# Patient Record
Sex: Female | Born: 1975 | Race: White | Hispanic: No | Marital: Married | Smoking: Never smoker
Health system: Southern US, Community
[De-identification: ages and names within clinical notes are randomized; demographics above are authoritative.]

## PROBLEM LIST (undated history)

## (undated) DIAGNOSIS — E611 Iron deficiency: Secondary | ICD-10-CM

## (undated) DIAGNOSIS — M797 Fibromyalgia: Secondary | ICD-10-CM

## (undated) DIAGNOSIS — C801 Malignant (primary) neoplasm, unspecified: Secondary | ICD-10-CM

## (undated) HISTORY — PX: BREAST BIOPSY: SHX20

## (undated) HISTORY — PX: ENDOMETRIAL ABLATION: SHX621

## (undated) MED FILL — Iron Sucrose Inj 20 MG/ML (Fe Equiv): INTRAVENOUS | Qty: 10 | Status: AC

## (undated) NOTE — *Deleted (*Deleted)
[facility]  [address] [place], [place] [address] Phone: [phone]  Fax: [phone]   Clinic Day:  07/05/2020  Referring physician: Christeen Douglas, MD  Chief Complaint: Alicia Washington is a 29 y.o. female with iron deficiency and B12 deficiency who is seen for 6 month assessment.    HPI: The patient was last seen in the hematology clinic on 01/05/2020. At that time, she felt that she was becoming fatigued.  Exam was stable. Methylmalonic acid was 129.  Vitamin B12 was 448 and folate was 9.2.  She received a vitamin B12 injection.  She received Venofer weekly x 2 (01/05/2020 - 01/12/2020).   Labs on 04/11/2020 showed a hematocrit of 38.0, hemoglobin 13.0, platelets 267,000, WBC 7,300. Ferritin was 102.  During the interim, ***   Past Medical History:  Diagnosis Date  . Cancer (HCC)    skin  . Fibromyalgia   . Low iron     Past Surgical History:  Procedure Laterality Date  . BREAST BIOPSY Left    core- neg  . CESAREAN SECTION    . ENDOMETRIAL ABLATION      Family History  Problem Relation Age of Onset  . Cancer Mother   . Cancer Maternal Grandfather   . Breast cancer Neg Hx     Social History:  reports that she has never smoked. She has never used smokeless tobacco. She reports that she does not drink alcohol and does not use drugs. She has 2 children (daughter 50, son 97). 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 She lives in [place]. 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 If one child gets COVID every one has to go into quarantine and stay home. The patient is alone*** today.  Allergies: No Known Allergies  Current Medications: Current Outpatient Medications  Medication Sig Dispense Refill  . Cyanocobalamin 1000 MCG/ML KIT Inject 1,000 mcg as directed.    . fluticasone (FLONASE) 50 MCG/ACT nasal spray Place into the nose.    . IRON SUCROSE IV Inject into the vein every 30 (thirty) days.    Marland Kitchen  loratadine (CLARITIN) 10 MG tablet Take by mouth.    Marland Kitchen omeprazole (PRILOSEC) 40 MG capsule Take 40 mg by mouth daily.  1  . predniSONE (DELTASONE) 20 MG tablet 3 tabs po qd x 2 days, then 2 tabs po qd x 3 days, then 1 tab po qd x 3 days, then half a tab po qd x 2 days (Patient not taking: Reported on 01/05/2020) 16 tablet 0  . sertraline (ZOLOFT) 100 MG tablet Take 100 mg by mouth daily.      No current facility-administered medications for this visit.   Facility-Administered Medications Ordered in Other Visits  Medication Dose Route Frequency Provider Last Rate Last Admin  . sodium chloride flush (NS) 0.9 % injection 10 mL  10 mL Intracatheter Once PRN Verlee Monte, NP      . sodium chloride flush (NS) 0.9 % injection 3 mL  3 mL Intracatheter Once PRN Verlee Monte, NP        Review of Systems  Constitutional: Positive for malaise/fatigue (persists). Negative for chills, diaphoresis, fever and weight loss.       Feels "good".   HENT: Negative.  Negative for congestion, ear pain, hearing loss, nosebleeds, sinus pain and sore throat.   Eyes: Negative.  Negative for blurred vision, double vision and photophobia.  Respiratory: Positive for shortness of breath (with  exertion x 1 month). Negative for cough, hemoptysis and sputum production.   Cardiovascular: Positive for palpitations (intermittent; improved). Negative for chest pain, orthopnea, leg swelling and PND.  Gastrointestinal: Negative for abdominal pain, blood in stool, constipation, diarrhea, heartburn, melena, nausea and vomiting.  Genitourinary: Negative.  Negative for dysuria, frequency, hematuria and urgency.  Musculoskeletal: Negative for back pain, joint pain, myalgias and neck pain.  Skin: Negative.  Negative for rash.  Neurological: Negative.  Negative for dizziness, tremors, sensory change, speech change, focal weakness, weakness and headaches.  Endo/Heme/Allergies: Negative.   Psychiatric/Behavioral: Negative.  Negative for  depression and memory loss. The patient is not nervous/anxious and does not have insomnia.   All other systems reviewed and are negative.  Performance status (ECOG): 1 - Symptomatic but completely ambulatory***  Vitals There were no vitals taken for this visit.   Physical Exam Vitals and nursing note reviewed.  Constitutional:      General: She is not in acute distress.    Appearance: She is well-developed. She is not diaphoretic.  HENT:     Head: Normocephalic and atraumatic.     Mouth/Throat:     Pharynx: No oropharyngeal exudate.  Eyes:     General: No scleral icterus.       Right eye: No discharge.        Left eye: No discharge.     Conjunctiva/sclera: Conjunctivae normal.     Pupils: Pupils are equal, round, and reactive to light.     Comments: Blue eyes.   Neck:     Vascular: No JVD.  Cardiovascular:     Rate and Rhythm: Normal rate and regular rhythm.     Heart sounds: Normal heart sounds. No murmur heard.  No friction rub. No gallop.   Pulmonary:     Effort: Pulmonary effort is normal. No respiratory distress.     Breath sounds: Normal breath sounds. No wheezing or rales.  Abdominal:     General: Bowel sounds are normal. There is no distension.     Palpations: Abdomen is soft. There is no mass.     Tenderness: There is no abdominal tenderness. There is no guarding or rebound.  Musculoskeletal:        General: No tenderness. Normal range of motion.     Cervical back: Normal range of motion and neck supple.  Lymphadenopathy:     Head:     Right side of head: No preauricular, posterior auricular or occipital adenopathy.     Left side of head: No preauricular, posterior auricular or occipital adenopathy.     Cervical: No cervical adenopathy.     Upper Body:     Right upper body: No supraclavicular adenopathy.     Left upper body: No supraclavicular adenopathy.  Skin:    General: Skin is warm and dry.     Coloration: Skin is not pale.     Findings: No erythema or  rash.  Neurological:     Mental Status: She is alert and oriented to person, place, and time.  Psychiatric:        Behavior: Behavior normal.        Thought Content: Thought content normal.        Judgment: Judgment normal.    No visits with results within 3 Day(s) from this visit.  Latest known visit with results is:  Appointment on 04/11/2020  Component Date Value Ref Range Status  . Ferritin 04/11/2020 102  11 - 307 ng/mL Final   Performed  at [facility], [address], [place], [place] [address]  . WBC 04/11/2020 7.3  4.0 - 10.5 K/uL Final  . RBC 04/11/2020 4.34  3.87 - 5.11 MIL/uL Final  . Hemoglobin 04/11/2020 13.0  12.0 - 15.0 g/dL Final  . HCT 13/03/6577 38.0  36 - 46 % Final  . MCV 04/11/2020 87.6  80.0 - 100.0 fL Final  . MCH 04/11/2020 30.0  26.0 - 34.0 pg Final  . MCHC 04/11/2020 34.2  30.0 - 36.0 g/dL Final  . RDW 46/96/2952 12.7  11.5 - 15.5 % Final  . Platelets 04/11/2020 267  150 - 400 K/uL Final  . nRBC 04/11/2020 0.0  0.0 - 0.2 % Final  . Neutrophils Relative % 04/11/2020 66  % Final  . Neutro Abs 04/11/2020 4.8  1.7 - 7.7 K/uL Final  . Lymphocytes Relative 04/11/2020 24  % Final  . Lymphs Abs 04/11/2020 1.8  0.7 - 4.0 K/uL Final  . Monocytes Relative 04/11/2020 7  % Final  . Monocytes Absolute 04/11/2020 0.5  0.1 - 1.0 K/uL Final  . Eosinophils Relative 04/11/2020 2  % Final  . Eosinophils Absolute 04/11/2020 0.1  0.0 - 0.5 K/uL Final  . Basophils Relative 04/11/2020 0  % Final  . Basophils Absolute 04/11/2020 0.0  0.0 - 0.1 K/uL Final  . Immature Granulocytes 04/11/2020 1  % Final  . Abs Immature Granulocytes 04/11/2020 0.04  0.00 - 0.07 K/uL Final   Performed at [facility], [address], [place], [place] [address]    Assessment:  ROGELIO WINBUSH is a 76 y.o. female with a long standing history ofiron deficiency anemia. Dietis modest. She is unable to tolerate oral iron. She has chronic diarrhea with some dark stools.   Work-up on 04/30/2018 revealed a hematocrit 23.9, hemoglobin 7.0, and MCV 64.5. Ferritin was 2. Iron saturation was 2% with a TIBC of 499. Reticulocyte count was 2.5%. B12 was 184. Normal studies included: TSH, ANA, and folate (13.5). Urinalysis on 05/07/2018 revealed no hematuria.   She has received IV ironin the past. She received 1 unit of PRBCson 05/01/2018. She received Venoferweekly x 4 (04/30/2018 - 05/21/2018),x 2 (06/12/2018 and 06/23/2018), x4 (12/22/2018 - 01/12/2019), 07/15/2019, x 3 (09/15/2019 - 09/30/2019), and x 2 (01/05/2020 - 01/12/2020).  Ferritinhas been followed: 4 on 08/14/2011, 133 on 10/24/2011, 125 on 12/17/2011, 18 on 10/08/2012, 3 on 04/24/2018, 2 on 04/30/2018, 52 on 06/11/2018, 63 on 08/11/2018, 33 on 11/16/2018, 19 on 12/15/2018, 80 on 02/16/2019, 46 on 03/31/2019, 33 on 05/21/2019, 21 on 07/07/2019, 13 on 09/10/2019, 99 on 11/10/2019, 57 on 01/04/2020, and 102 on 04/11/2020.  She receives Venofer when ferritin < 100.  EGD on 05/11/2018 revealed erythematous mucosa in the antrum (minmal chronic gastritis and mild foveolar hyperplasia) and a 1 cm hiatal hernia. Colonoscopyon 05/11/2018 was normal. Random biopsies revealed no abnormalities.Capsule enteroscopyon 06/15/2018 was normal.  She has B12 deficiency. Intrinsic factor and anti-parietal cell antibody were normal on 06/12/2018. She began oral B12 on 05/01/2018. She began B12 injections on 06/12/2018 (last 01/05/2020).B12 was 184 on 04/30/2018,316 on 06/11/2018, 380 on 09/22/2018, and 448 on 01/05/2020.Folate was 9.2 on 01/05/2020.  Symptomatically, ***  Plan: 1.   Review labs***  2.Iron deficiency anemia Hematocrit 37.7.  Hemoglobin12.4. MCV 88.9. Ferritin57. PriorEGD revealed mild gastritis. Colonoscopy and capsule study were normal. Guaiac card x 1 + (unclear significance with hemorrhoids).  Urinalysiswas negative on 12/15/2018. Patient notes symptoms when her ferritin is < 100.  Venofer today and  weekly x 1 (total 2).   Urine pregnancy test. 3. B12 deficiency B12 today and monthly (last 01/05/2020).             Check folate annually. 4.   Fatigue            TSH and free T4 were normal on 09/15/2019.  Check B12 and MMA per patient request. 5.   RTC in 3 months for labs (CBC and ferritin). 6.   RTC in 6 months for MD assessment, labs (CBC with diff, ferritin- day before), and +/- Venofer.  I discussed the assessment and treatment plan with the patient.  The patient was provided an opportunity to ask questions and all were answered.  The patient agreed with the plan and demonstrated an understanding of the instructions.  The patient was advised to call back if the symptoms worsen or if the condition fails to improve as anticipated.  I provided *** minutes of face-to-face time during this this encounter and > 50% was spent counseling as documented under my assessment and plan.  Rosey Bath, MD, PhD    07/05/2020, 2:06 PM   I, Danella Penton Tufford, am acting as a Neurosurgeon for Rosey Bath, MD.  I, Mckynleigh Mussell C. Merlene Pulling, MD, have reviewed the above documentation for accuracy and completeness, and I agree with the above.

---

## 2011-04-25 ENCOUNTER — Ambulatory Visit: Payer: Self-pay | Admitting: Obstetrics & Gynecology

## 2011-06-06 ENCOUNTER — Ambulatory Visit: Payer: Self-pay | Admitting: Internal Medicine

## 2011-06-14 ENCOUNTER — Ambulatory Visit: Payer: Self-pay | Admitting: Internal Medicine

## 2011-07-06 ENCOUNTER — Ambulatory Visit: Payer: Self-pay | Admitting: Internal Medicine

## 2011-08-14 ENCOUNTER — Ambulatory Visit: Payer: Self-pay | Admitting: Internal Medicine

## 2011-08-14 LAB — IRON AND TIBC
Iron Bind.Cap.(Total): 417 ug/dL (ref 250–450)
Iron Saturation: 8 %
Iron: 34 ug/dL — ABNORMAL LOW (ref 50–170)
Unbound Iron-Bind.Cap.: 383 ug/dL

## 2011-08-14 LAB — CBC CANCER CENTER
Basophil #: 0 x10 3/mm (ref 0.0–0.1)
Basophil %: 0.5 %
Eosinophil #: 0.1 x10 3/mm (ref 0.0–0.7)
Eosinophil %: 1.9 %
HCT: 33.4 % — ABNORMAL LOW (ref 35.0–47.0)
HGB: 11.2 g/dL — ABNORMAL LOW (ref 12.0–16.0)
Lymphocyte #: 1.5 x10 3/mm (ref 1.0–3.6)
Lymphocyte %: 30.7 %
MCH: 26.1 pg (ref 26.0–34.0)
MCHC: 33.5 g/dL (ref 32.0–36.0)
MCV: 78 fL — ABNORMAL LOW (ref 80–100)
Monocyte #: 0.5 x10 3/mm (ref 0.0–0.7)
Monocyte %: 10.3 %
Neutrophil #: 2.7 x10 3/mm (ref 1.4–6.5)
Neutrophil %: 56.6 %
Platelet: 258 x10 3/mm (ref 150–440)
RBC: 4.28 10*6/uL (ref 3.80–5.20)
RDW: 14.7 % — ABNORMAL HIGH (ref 11.5–14.5)
WBC: 4.8 x10 3/mm (ref 3.6–11.0)

## 2011-08-14 LAB — FERRITIN: Ferritin (ARMC): 4 ng/mL — ABNORMAL LOW (ref 8–388)

## 2011-09-06 ENCOUNTER — Ambulatory Visit: Payer: Self-pay | Admitting: Internal Medicine

## 2011-10-24 ENCOUNTER — Ambulatory Visit: Payer: Self-pay | Admitting: Internal Medicine

## 2011-10-24 LAB — IRON AND TIBC
Iron Bind.Cap.(Total): 291 ug/dL (ref 250–450)
Iron Saturation: 23 %
Iron: 68 ug/dL (ref 50–170)
Unbound Iron-Bind.Cap.: 223 ug/dL

## 2011-10-24 LAB — CBC CANCER CENTER
Basophil #: 0 x10 3/mm (ref 0.0–0.1)
Basophil %: 0.8 %
Eosinophil #: 0.1 x10 3/mm (ref 0.0–0.7)
Eosinophil %: 2.8 %
HCT: 40.9 % (ref 35.0–47.0)
HGB: 13.5 g/dL (ref 12.0–16.0)
Lymphocyte #: 1.3 x10 3/mm (ref 1.0–3.6)
Lymphocyte %: 31.4 %
MCH: 28.1 pg (ref 26.0–34.0)
MCHC: 33 g/dL (ref 32.0–36.0)
MCV: 85.3 fL (ref 80–100)
Monocyte #: 0.4 x10 3/mm (ref 0.0–0.7)
Monocyte %: 8.6 %
Neutrophil #: 2.4 x10 3/mm (ref 1.4–6.5)
Neutrophil %: 56.4 %
Platelet: 204 x10 3/mm (ref 150–440)
RBC: 4.8 10*6/uL (ref 3.80–5.20)
RDW: 16.5 % — ABNORMAL HIGH (ref 11.5–14.5)
WBC: 4.2 x10 3/mm (ref 3.6–11.0)

## 2011-10-24 LAB — FERRITIN: Ferritin (ARMC): 133 ng/mL (ref 8–388)

## 2011-11-04 ENCOUNTER — Ambulatory Visit: Payer: Self-pay | Admitting: Internal Medicine

## 2011-12-16 ENCOUNTER — Ambulatory Visit: Payer: Self-pay | Admitting: Internal Medicine

## 2011-12-17 LAB — CBC CANCER CENTER
Basophil #: 0 x10 3/mm (ref 0.0–0.1)
Basophil %: 0.6 %
Eosinophil #: 0.2 x10 3/mm (ref 0.0–0.7)
Eosinophil %: 3.7 %
HCT: 39.1 % (ref 35.0–47.0)
HGB: 13.4 g/dL (ref 12.0–16.0)
Lymphocyte #: 1.4 x10 3/mm (ref 1.0–3.6)
Lymphocyte %: 33.1 %
MCH: 30.1 pg (ref 26.0–34.0)
MCHC: 34.1 g/dL (ref 32.0–36.0)
MCV: 88 fL (ref 80–100)
Monocyte #: 0.4 x10 3/mm (ref 0.2–0.9)
Monocyte %: 9.6 %
Neutrophil #: 2.3 x10 3/mm (ref 1.4–6.5)
Neutrophil %: 53 %
Platelet: 195 x10 3/mm (ref 150–440)
RBC: 4.44 10*6/uL (ref 3.80–5.20)
RDW: 13 % (ref 11.5–14.5)
WBC: 4.4 x10 3/mm (ref 3.6–11.0)

## 2011-12-17 LAB — FERRITIN: Ferritin (ARMC): 125 ng/mL (ref 8–388)

## 2012-01-04 ENCOUNTER — Ambulatory Visit: Payer: Self-pay | Admitting: Internal Medicine

## 2012-02-18 ENCOUNTER — Ambulatory Visit: Payer: Self-pay | Admitting: Emergency Medicine

## 2012-09-24 ENCOUNTER — Ambulatory Visit: Payer: Self-pay | Admitting: Internal Medicine

## 2012-10-08 ENCOUNTER — Ambulatory Visit: Payer: Self-pay | Admitting: Internal Medicine

## 2012-10-08 LAB — IRON AND TIBC
Iron Bind.Cap.(Total): 371 ug/dL (ref 250–450)
Iron Saturation: 17 %
Iron: 63 ug/dL (ref 50–170)
Unbound Iron-Bind.Cap.: 308 ug/dL

## 2012-10-08 LAB — CBC CANCER CENTER
Basophil #: 0 x10 3/mm (ref 0.0–0.1)
Basophil %: 0.6 %
Eosinophil #: 0.1 x10 3/mm (ref 0.0–0.7)
Eosinophil %: 1.7 %
HCT: 36 % (ref 35.0–47.0)
HGB: 12.3 g/dL (ref 12.0–16.0)
Lymphocyte #: 1.6 x10 3/mm (ref 1.0–3.6)
Lymphocyte %: 28.3 %
MCH: 29.4 pg (ref 26.0–34.0)
MCHC: 34.3 g/dL (ref 32.0–36.0)
MCV: 86 fL (ref 80–100)
Monocyte #: 0.5 x10 3/mm (ref 0.2–0.9)
Monocyte %: 8.4 %
Neutrophil #: 3.4 x10 3/mm (ref 1.4–6.5)
Neutrophil %: 61 %
Platelet: 253 x10 3/mm (ref 150–440)
RBC: 4.2 10*6/uL (ref 3.80–5.20)
RDW: 13 % (ref 11.5–14.5)
WBC: 5.5 x10 3/mm (ref 3.6–11.0)

## 2012-10-08 LAB — FERRITIN: Ferritin (ARMC): 18 ng/mL (ref 8–388)

## 2012-11-03 ENCOUNTER — Ambulatory Visit: Payer: Self-pay | Admitting: Internal Medicine

## 2015-04-26 ENCOUNTER — Ambulatory Visit: Payer: 59

## 2015-04-26 ENCOUNTER — Encounter: Payer: Self-pay | Admitting: Emergency Medicine

## 2015-04-26 ENCOUNTER — Ambulatory Visit
Admission: EM | Admit: 2015-04-26 | Discharge: 2015-04-26 | Disposition: A | Discharge status: Home / Self Care | Payer: 59 | Attending: Family Medicine | Admitting: Family Medicine

## 2015-04-26 DIAGNOSIS — D1720 Benign lipomatous neoplasm of skin and subcutaneous tissue of unspecified limb: Secondary | ICD-10-CM

## 2015-04-26 HISTORY — DX: Fibromyalgia: M79.7

## 2015-04-26 NOTE — Discharge Instructions (Signed)
Lipoma  A lipoma is a noncancerous (benign) tumor composed of fat cells. They are usually found under the skin (subcutaneous). A lipoma may occur in any tissue of the body that contains fat. Common areas for lipomas to appear include the back, shoulders, buttocks, and thighs. Lipomas are a very common soft tissue growth. They are soft and grow slowly. Most problems caused by a lipoma depend on where it is growing.  DIAGNOSIS   A lipoma can be diagnosed with a physical exam. These tumors rarely become cancerous, but radiographic studies can help determine this for certain. Studies used may include:  · Computerized X-ray scans (CT or CAT scan).  · Computerized magnetic scans (MRI).  TREATMENT   Small lipomas that are not causing problems may be watched. If a lipoma continues to enlarge or causes problems, removal is often the best treatment. Lipomas can also be removed to improve appearance. Surgery is done to remove the fatty cells and the surrounding capsule. Most often, this is done with medicine that numbs the area (local anesthetic). The removed tissue is examined under a microscope to make sure it is not cancerous. Keep all follow-up appointments with your caregiver.  SEEK MEDICAL CARE IF:   · The lipoma becomes larger or hard.  · The lipoma becomes painful, red, or increasingly swollen. These could be signs of infection or a more serious condition.  Document Released: 07/12/2002 Document Revised: 10/14/2011 Document Reviewed: 12/22/2009  ExitCare® Patient Information ©2015 [employer]. This information is not intended to replace advice given to you by your health care provider. Make sure you discuss any questions you have with your health care provider.

## 2015-04-26 NOTE — ED Provider Notes (Signed)
CSN: [id number]     Arrival date & time 04/26/15  1229 History   First MD Initiated Contact with Patient 04/26/15 1352     Chief Complaint  Patient presents with  . Cyst   (Consider location/radiation/quality/duration/timing/severity/associated sxs/prior Treatment) HPI   This a 39 year old female who presents with a large lump on the lateral aspect of her distal thigh as was diagnosed as a lipoma and she thinks she has found another one in the anterior thigh. Is very concerned this may be cancer her mother has history of tumors. Her previous primary care physician reassured her that it was only a lipoma but with the new growth that she has discovered she is more concerned now. She does not have any pain with these has not noticed any increase in size nor pain in her thigh with ambulation. His had no weight loss or change in appetite.  Past Medical History  Diagnosis Date  . Fibromyalgia    Past Surgical History  Procedure Laterality Date  . Cesarean section    . Endometrial ablation     Family History  Problem Relation Age of Onset  . Cancer Mother    Social History  Substance Use Topics  . Smoking status: Never Smoker   . Smokeless tobacco: None  . Alcohol Use: No   OB History    No data available     Review of Systems  Constitutional: Negative for chills, diaphoresis, activity change and fatigue.  All other systems reviewed and are negative.   Allergies  Review of patient's allergies indicates no known allergies.  Home Medications   Prior to Admission medications   Medication Sig Start Date End Date Taking? Authorizing Provider  sertraline (ZOLOFT) 100 MG tablet Take 125 mg by mouth daily.   Yes Historical Provider, MD   Meds Ordered and Administered this Visit  Medications - No data to display  BP 126/88 mmHg  Pulse 82  Temp(Src) 98.1 F (36.7 C) (Tympanic)  Resp 20  Ht 5\' 4"  (1.626 m)  Wt 136 lb (61.689 kg)  BMI 23.33 kg/m2  SpO2 99%  LMP  No data  found.   Physical Exam  Constitutional: She appears well-developed and well-nourished. No distress.  HENT:  Head: Normocephalic and atraumatic.  Musculoskeletal:  Semination left leg shows no obvious deformities. She has 2 lesions palpable under the skin. Is not fixed to the skin. One measures 3 cm x 5 cm which is the most proximal and anterior of the 2 lesions the second is much smaller again shows no deformity and has no fixation to the skin measuring 2-1/2 cm in diameter. There is no fluctuance or induration present.  Skin: She is not diaphoretic.  Nursing note and vitals reviewed.   ED Course  Procedures (including critical care time)  Labs Review Labs Reviewed - No data to display  Imaging Review Dg Femur Min 2 Views Left  04/26/2015   CLINICAL DATA:  Left lateral distal thigh lipoma, suprapatellar mass  EXAM: LEFT FEMUR 2 VIEWS  COMPARISON:  No similar prior exam is available at this institution for comparison or on [facility].  FINDINGS: There is no evidence of fracture or other focal bone lesions. Soft tissues are unremarkable.  IMPRESSION: No acute abnormality or radiopaque mass. Further evaluation of the questioned palpable finding could be performed with soft tissue ultrasound or MRI with contrast depending on the clinical presentation.   Electronically Signed   By: Conchita Paris M.D.   On: 04/26/2015  15:10     Visual Acuity Review  Right Eye Distance:   Left Eye Distance:   Bilateral Distance:    Right Eye Near:   Left Eye Near:    Bilateral Near:         MDM   1. Lipoma of lower extremity    Plan: 1. Diagnosis reviewed with patient 2. rx as per orders; risks, benefits, potential side effects reviewed with patient 3. Recommend supportive treatment with assurance 4. F/u prn if symptoms worsen or don't improve     Lorin Picket, PA-C 04/26/15 1518

## 2015-04-26 NOTE — ED Notes (Signed)
Cyst on left upper leg x days no pain

## 2015-09-14 ENCOUNTER — Other Ambulatory Visit: Payer: Self-pay | Admitting: Obstetrics and Gynecology

## 2015-09-14 DIAGNOSIS — Z1231 Encounter for screening mammogram for malignant neoplasm of breast: Secondary | ICD-10-CM

## 2015-09-25 ENCOUNTER — Ambulatory Visit: Payer: 59

## 2016-11-14 ENCOUNTER — Other Ambulatory Visit: Payer: Self-pay | Admitting: Obstetrics and Gynecology

## 2016-11-14 DIAGNOSIS — Z1231 Encounter for screening mammogram for malignant neoplasm of breast: Secondary | ICD-10-CM

## 2016-12-11 ENCOUNTER — Ambulatory Visit: Payer: 59

## 2017-01-08 ENCOUNTER — Other Ambulatory Visit: Payer: Self-pay | Admitting: Obstetrics & Gynecology

## 2017-01-14 ENCOUNTER — Ambulatory Visit
Admission: RE | Admit: 2017-01-14 | Discharge: 2017-01-14 | Disposition: A | Discharge status: Home / Self Care | Payer: 59 | Source: Ambulatory Visit | Attending: Obstetrics and Gynecology | Admitting: Obstetrics and Gynecology

## 2017-01-14 DIAGNOSIS — Z1231 Encounter for screening mammogram for malignant neoplasm of breast: Secondary | ICD-10-CM | POA: Insufficient documentation

## 2017-01-14 HISTORY — DX: Malignant (primary) neoplasm, unspecified: C80.1

## 2017-01-16 ENCOUNTER — Other Ambulatory Visit: Payer: Self-pay | Admitting: *Deleted

## 2017-01-16 ENCOUNTER — Inpatient Hospital Stay
Admission: RE | Admit: 2017-01-16 | Discharge: 2017-01-16 | Disposition: A | Discharge status: Home / Self Care | Payer: Self-pay | Source: Ambulatory Visit | Attending: *Deleted | Admitting: *Deleted

## 2017-01-16 DIAGNOSIS — Z9289 Personal history of other medical treatment: Secondary | ICD-10-CM

## 2017-06-14 ENCOUNTER — Emergency Department
Admission: EM | Admit: 2017-06-14 | Discharge: 2017-06-14 | Disposition: A | Discharge status: Home / Self Care | Payer: 59 | Attending: Emergency Medicine | Admitting: Emergency Medicine

## 2017-06-14 ENCOUNTER — Emergency Department: Payer: 59

## 2017-06-14 DIAGNOSIS — R0789 Other chest pain: Secondary | ICD-10-CM | POA: Insufficient documentation

## 2017-06-14 DIAGNOSIS — Z85828 Personal history of other malignant neoplasm of skin: Secondary | ICD-10-CM | POA: Diagnosis not present

## 2017-06-14 DIAGNOSIS — Z79899 Other long term (current) drug therapy: Secondary | ICD-10-CM | POA: Insufficient documentation

## 2017-06-14 DIAGNOSIS — M546 Pain in thoracic spine: Secondary | ICD-10-CM | POA: Insufficient documentation

## 2017-06-14 LAB — CBC
HCT: 32.8 % — ABNORMAL LOW (ref 35.0–47.0)
Hemoglobin: 10.4 g/dL — ABNORMAL LOW (ref 12.0–16.0)
MCH: 23.2 pg — ABNORMAL LOW (ref 26.0–34.0)
MCHC: 31.7 g/dL — ABNORMAL LOW (ref 32.0–36.0)
MCV: 73.3 fL — ABNORMAL LOW (ref 80.0–100.0)
Platelets: 278 10*3/uL (ref 150–440)
RBC: 4.47 MIL/uL (ref 3.80–5.20)
RDW: 16.3 % — ABNORMAL HIGH (ref 11.5–14.5)
WBC: 6.4 10*3/uL (ref 3.6–11.0)

## 2017-06-14 LAB — BASIC METABOLIC PANEL
Anion gap: 10 (ref 5–15)
BUN: 14 mg/dL (ref 6–20)
CO2: 22 mmol/L (ref 22–32)
Calcium: 9.2 mg/dL (ref 8.9–10.3)
Chloride: 104 mmol/L (ref 101–111)
Creatinine, Ser: 0.78 mg/dL (ref 0.44–1.00)
GFR calc Af Amer: >60 mL/min (ref >60)
GFR calc non Af Amer: >60 mL/min (ref >60)
Glucose, Bld: 112 mg/dL — ABNORMAL HIGH (ref 65–99)
Potassium: 3.9 mmol/L (ref 3.5–5.1)
Sodium: 136 mmol/L (ref 135–145)

## 2017-06-14 LAB — TROPONIN I: Troponin I: <0.03 ng/mL (ref <0.03)

## 2017-06-14 NOTE — Discharge Instructions (Signed)
It was a pleasure to take care of you today, and thank you for coming to our emergency department.  If you have any questions or concerns before leaving please ask the nurse to grab me and I'm more than happy to go through your aftercare instructions again.  If you were prescribed any opioid pain medication today such as Norco, Vicodin, Percocet, morphine, hydrocodone, or oxycodone please make sure you do not drive when you are taking this medication as it can alter your ability to drive safely.  If you have any concerns once you are home that you are not improving or are in fact getting worse before you can make it to your follow-up appointment, please do not hesitate to call 911 and come back for further evaluation.  Darel Hong, MD  Results for orders placed or performed during the hospital encounter of 22/33/61  Basic metabolic panel  Result Value Ref Range   Sodium 136 135 - 145 mmol/L   Potassium 3.9 3.5 - 5.1 mmol/L   Chloride 104 101 - 111 mmol/L   CO2 22 22 - 32 mmol/L   Glucose, Bld 112 (H) 65 - 99 mg/dL   BUN 14 6 - 20 mg/dL   Creatinine, Ser 0.78 0.44 - 1.00 mg/dL   Calcium 9.2 8.9 - 10.3 mg/dL   GFR calc non Af Amer >60 >60 mL/min   GFR calc Af Amer >60 >60 mL/min   Anion gap 10 5 - 15  CBC  Result Value Ref Range   WBC 6.4 3.6 - 11.0 K/uL   RBC 4.47 3.80 - 5.20 MIL/uL   Hemoglobin 10.4 (L) 12.0 - 16.0 g/dL   HCT 32.8 (L) 35.0 - 47.0 %   MCV 73.3 (L) 80.0 - 100.0 fL   MCH 23.2 (L) 26.0 - 34.0 pg   MCHC 31.7 (L) 32.0 - 36.0 g/dL   RDW 16.3 (H) 11.5 - 14.5 %   Platelets 278 150 - 440 K/uL  Troponin I  Result Value Ref Range   Troponin I <0.03 <0.03 ng/mL   Dg Chest 2 View  Result Date: 06/14/2017 CLINICAL DATA:  Upper back pain radiating to RIGHT chest. EXAM: CHEST  2 VIEW COMPARISON:  None. FINDINGS: Cardiomediastinal silhouette is normal. No pleural effusions or focal consolidations. Trachea projects midline and there is no pneumothorax. Soft tissue planes and  included osseous structures are non-suspicious. IMPRESSION: Normal chest. Electronically Signed   By: Elon Alas M.D.   On: 06/14/2017 02:44

## 2017-06-14 NOTE — ED Provider Notes (Signed)
[facility] Emergency Department Provider Note  ____________________________________________   First MD Initiated Contact with Patient 06/14/17 [phone]     (approximate)  I have reviewed the triage vital signs and the nursing notes.   HISTORY  Chief Complaint Back Pain and Pleurisy   HPI Alicia Washington is a 40 y.o. female who comes to the emergency department with sudden onset moderate to severe right upper back pain radiating forward to her right chest that began 2 hours prior to arrival.  Seem to wake her from her sleep.  The pain was constant ever since and seems to be worse with twisting and movement.  It is nonexertional.  She does have mild shortness of breath and the pain is worse with deep breaths.  She has no history of pulmonary embolism or deep vein thrombosis.  No history of recent surgery or travel or immobilization.  The pain is not ripping or tearing.  Past Medical History:  Diagnosis Date  . Cancer ([place])    skin  . Fibromyalgia     There are no active problems to display for this patient.   Past Surgical History:  Procedure Laterality Date  . BREAST BIOPSY Left    core- neg  . CESAREAN SECTION    . ENDOMETRIAL ABLATION      Prior to Admission medications   Medication Sig Start Date End Date Taking? Authorizing Provider  sertraline (ZOLOFT) 100 MG tablet Take 125 mg by mouth daily.    [provider]    Allergies Patient has no known allergies.  Family History  Problem Relation Age of Onset  . Cancer Mother   . Breast cancer Neg Hx     Social History Social History   Tobacco Use  . Smoking status: Never Smoker  Substance Use Topics  . Alcohol use: No  . Drug use: Not on file    Review of Systems Constitutional: No fever/chills Eyes: No visual changes. ENT: No sore throat. Cardiovascular: Positive for chest pain. Respiratory: Positive for shortness of breath. Gastrointestinal: No abdominal pain.  No  nausea, no vomiting.  No diarrhea.  No constipation. Genitourinary: Negative for dysuria. Musculoskeletal: Negative for back pain. Skin: Negative for rash. Neurological: Negative for headaches, focal weakness or numbness.   ____________________________________________   PHYSICAL EXAM:  VITAL SIGNS: ED Triage Vitals  Enc Vitals Group     BP 06/14/17 0221 116/83     Pulse Rate 06/14/17 0221 91     Resp 06/14/17 0221 17     Temp 06/14/17 0221 98.7 F (37.1 C)     Temp Source 06/14/17 0221 Oral     SpO2 06/14/17 0221 100 %     Weight 06/14/17 0218 136 lb (61.7 kg)     Height --      Head Circumference --      Peak Flow --      Pain Score 06/14/17 0218 7     Pain Loc --      Pain Edu? --      Excl. in [place]? --     Constitutional: Alert and oriented x4 pleasant cooperative speaks full clear sentences no diaphoresis Eyes: PERRL EOMI. Head: Atraumatic. Nose: No congestion/rhinnorhea. Mouth/Throat: No trismus Neck: No stridor.   Cardiovascular: Normal rate, regular rhythm. Grossly normal heart sounds.  Good peripheral circulation. Respiratory: Normal respiratory effort.  No retractions. Lungs CTAB and moving good air Gastrointestinal: Soft nontender Musculoskeletal: No lower extremity edema exquisitely tender to right thoracic back  Neurologic:  Normal speech and language. No gross focal neurologic deficits are appreciated. Skin:  Skin is warm, dry and intact. No rash noted. Psychiatric: Mood and affect are normal. Speech and behavior are normal.    ____________________________________________   DIFFERENTIAL includes but not limited to  Acute coronary syndrome, pulmonary aneurysm, aortic dissection, muscular skeletal pain, pneumothorax ____________________________________________   LABS (all labs ordered are listed, but only abnormal results are displayed)  Labs Reviewed  BASIC METABOLIC PANEL - Abnormal; Notable for the following components:      Result Value    Glucose, Bld 112 (*)    All other components within normal limits  CBC - Abnormal; Notable for the following components:   Hemoglobin 10.4 (*)    HCT 32.8 (*)    MCV 73.3 (*)    MCH 23.2 (*)    MCHC 31.7 (*)    RDW 16.3 (*)    All other components within normal limits  TROPONIN I    Blood work reviewed and interpreted by me with no acute disease __________________________________________  EKG  ED ECG REPORT I, Darel Hong, the attending physician, personally viewed and interpreted this ECG.  Date: 06/14/2017 EKG Time:  Rate: 92 Rhythm: normal sinus rhythm Intervals: normal ST/T Wave abnormalities: normal Narrative Interpretation: no evidence of acute ischemia  ____________________________________________  RADIOLOGY  Chest x-ray reviewed by me with no acute disease ____________________________________________   PROCEDURES  Procedure(s) performed: no  Procedures  Critical Care performed: no  Observation: no ____________________________________________   INITIAL IMPRESSION / ASSESSMENT AND PLAN / ED COURSE  Pertinent labs & imaging results that were available during my care of the patient were reviewed by me and considered in my medical decision making (see chart for details).  Unclear etiology of the patient's symptoms.  She is particularly concerned that she may have a pneumothorax as her mother has had them in the past.  Chest x-ray is unremarkable.  Her symptoms are not consistent with acute coronary syndrome.  She is not hypertensive and I doubt aortic dissection at this time.  I also doubt pulmonary embolism as the patient is PERC negative.  She declines pain medication at this time.  She is exquisitely tender over her upper thoracic back and this very well could be musculoskeletal pain that is referred forward to her chest.  Strict return precautions have been given and the patient verbalizes understanding and agreement with plan.       ____________________________________________   FINAL CLINICAL IMPRESSION(S) / ED DIAGNOSES  Final diagnoses:  Atypical chest pain  Acute right-sided thoracic back pain      NEW MEDICATIONS STARTED DURING THIS VISIT:  This SmartLink is deprecated. Use AVSMEDLIST instead to display the medication list for a patient.   Note:  This document was prepared using Dragon voice recognition software and may include unintentional dictation errors.     Darel Hong, MD 06/14/17 [phone]

## 2017-06-14 NOTE — ED Triage Notes (Signed)
Patient c/o upper right back pain radiating to right chest.

## 2018-03-18 ENCOUNTER — Other Ambulatory Visit: Payer: Self-pay | Admitting: Obstetrics and Gynecology

## 2018-03-18 DIAGNOSIS — Z1231 Encounter for screening mammogram for malignant neoplasm of breast: Secondary | ICD-10-CM

## 2018-04-02 ENCOUNTER — Ambulatory Visit
Admission: RE | Admit: 2018-04-02 | Discharge: 2018-04-02 | Disposition: A | Discharge status: Home / Self Care | Payer: 59 | Source: Ambulatory Visit | Attending: Obstetrics and Gynecology | Admitting: Obstetrics and Gynecology

## 2018-04-02 DIAGNOSIS — Z1231 Encounter for screening mammogram for malignant neoplasm of breast: Secondary | ICD-10-CM | POA: Diagnosis not present

## 2018-04-30 ENCOUNTER — Encounter: Payer: Self-pay | Admitting: Hematology and Oncology

## 2018-04-30 ENCOUNTER — Inpatient Hospital Stay: Payer: 59

## 2018-04-30 ENCOUNTER — Inpatient Hospital Stay: Payer: 59 | Attending: Hematology and Oncology | Admitting: Hematology and Oncology

## 2018-04-30 VITALS — BP 103/66 | HR 72 | Temp 95.7°F | Resp 18

## 2018-04-30 VITALS — BP 115/77 | HR 76 | Temp 97.6°F | Resp 18 | Ht 62.0 in | Wt 133.5 lb

## 2018-04-30 DIAGNOSIS — D509 Iron deficiency anemia, unspecified: Secondary | ICD-10-CM

## 2018-04-30 DIAGNOSIS — E538 Deficiency of other specified B group vitamins: Secondary | ICD-10-CM

## 2018-04-30 DIAGNOSIS — D500 Iron deficiency anemia secondary to blood loss (chronic): Secondary | ICD-10-CM

## 2018-04-30 LAB — CBC WITH DIFFERENTIAL/PLATELET
Basophils Absolute: 0 10*3/uL (ref 0–0.1)
Basophils Relative: 1 %
Eosinophils Absolute: 0.1 10*3/uL (ref 0–0.7)
Eosinophils Relative: 2 %
HCT: 23.9 % — ABNORMAL LOW (ref 35.0–47.0)
Hemoglobin: 7 g/dL — ABNORMAL LOW (ref 12.0–16.0)
Lymphocytes Relative: 28 %
Lymphs Abs: 1.2 10*3/uL (ref 1.0–3.6)
MCH: 19 pg — ABNORMAL LOW (ref 26.0–34.0)
MCHC: 29.5 g/dL — ABNORMAL LOW (ref 32.0–36.0)
MCV: 64.5 fL — ABNORMAL LOW (ref 80.0–100.0)
Monocytes Absolute: 0.4 10*3/uL (ref 0.2–0.9)
Monocytes Relative: 10 %
Neutro Abs: 2.7 10*3/uL (ref 1.4–6.5)
Neutrophils Relative %: 59 %
Platelets: 302 10*3/uL (ref 150–440)
RBC: 3.71 MIL/uL — ABNORMAL LOW (ref 3.80–5.20)
RDW: 16.6 % — ABNORMAL HIGH (ref 11.5–14.5)
WBC: 4.5 10*3/uL (ref 3.6–11.0)

## 2018-04-30 LAB — COMPREHENSIVE METABOLIC PANEL
ALT: 11 U/L (ref 0–44)
AST: 12 U/L — ABNORMAL LOW (ref 15–41)
Albumin: 4.3 g/dL (ref 3.5–5.0)
Alkaline Phosphatase: 52 U/L (ref 38–126)
Anion gap: 6 (ref 5–15)
BUN: 11 mg/dL (ref 6–20)
CO2: 22 mmol/L (ref 22–32)
Calcium: 9 mg/dL (ref 8.9–10.3)
Chloride: 108 mmol/L (ref 98–111)
Creatinine, Ser: 0.73 mg/dL (ref 0.44–1.00)
GFR calc Af Amer: >60 mL/min (ref >60)
GFR calc non Af Amer: >60 mL/min (ref >60)
Glucose, Bld: 104 mg/dL — ABNORMAL HIGH (ref 70–99)
Potassium: 3.9 mmol/L (ref 3.5–5.1)
Sodium: 136 mmol/L (ref 135–145)
Total Bilirubin: 0.3 mg/dL (ref 0.3–1.2)
Total Protein: 7.8 g/dL (ref 6.5–8.1)

## 2018-04-30 LAB — PREPARE RBC (CROSSMATCH)

## 2018-04-30 LAB — RETICULOCYTES
RBC.: 3.61 MIL/uL — ABNORMAL LOW (ref 3.80–5.20)
Retic Count, Absolute: 90.3 10*3/uL (ref 19.0–183.0)
Retic Ct Pct: 2.5 % (ref 0.4–3.1)

## 2018-04-30 LAB — SAMPLE TO BLOOD BANK

## 2018-04-30 LAB — ABO/RH: ABO/RH(D): AB POS

## 2018-04-30 LAB — IRON AND TIBC
Iron: 10 ug/dL — ABNORMAL LOW (ref 28–170)
Saturation Ratios: 2 % — ABNORMAL LOW (ref 10.4–31.8)
TIBC: 499 ug/dL — ABNORMAL HIGH (ref 250–450)
UIBC: 489 ug/dL

## 2018-04-30 LAB — FOLATE: Folate: 13.5 ng/mL (ref >5.9)

## 2018-04-30 LAB — TSH: TSH: 1.273 u[IU]/mL (ref 0.350–4.500)

## 2018-04-30 LAB — VITAMIN B12: Vitamin B-12: 184 pg/mL (ref 180–914)

## 2018-04-30 LAB — FERRITIN: Ferritin: 2 ng/mL — ABNORMAL LOW (ref 11–307)

## 2018-04-30 MED ORDER — SODIUM CHLORIDE 0.9 % IV SOLN: 200.0000 mg | Freq: Once | INTRAVENOUS | Status: DC

## 2018-04-30 MED ORDER — SODIUM CHLORIDE 0.9 % IV SOLN
Freq: Once | INTRAVENOUS | Status: AC
  Administered 2018-04-30: 11:00:00 via INTRAVENOUS
  Filled 2018-04-30: qty 250

## 2018-04-30 MED ORDER — IRON SUCROSE 20 MG/ML IV SOLN
200.0000 mg | Freq: Once | INTRAVENOUS | Status: AC
  Administered 2018-04-30: 200 mg via INTRAVENOUS
  Filled 2018-04-30: qty 10

## 2018-04-30 NOTE — Progress Notes (Signed)
Patient here today as new evaluation regarding anemia.  Referred by Dr. Chauncey Mann.  Patient states she was supposed to have endoscopy and colonoscopy in the morning and GI called and cancelled this morning.  She has been rescheduled for October 7.  Patient states she is nauseated everytime she eats.  She eats maybe one meal a day and snacks throughout the day.  Patient states she always feels SOB.  O2 today 100%.  Patient accompanied by her mother today.

## 2018-04-30 NOTE — Progress Notes (Signed)
[facility] day:  04/30/2018  Chief Complaint: Alicia Washington is a 42 y.o. female with iron deficiency who is referred in consultation by Octavia Bruckner, PA-C for assessment and management.  HPI:  The patient has a history of iron deficiency anemia requiring IV iron in the past. She has had anemia "for as long as she can remember".  Last infusion was in 2014, which did little to improve her symptoms. Due to menorrhagia, she underwent endometrial ablation in 2007, which did not seem to improve her symptoms. She has a history of gastric ulcer at the age of 73. She has never required a blood transfusion.   She was seen by Dr. Leafy Ro, gynecologist, on 04/03/2018.  Hemoglobin was 7.2.  Oral iron was recommended.  She ordered iron pills from [employer].  She was seen in consultation by Dr. Alice Reichert on 04/20/2018 for microcytic anemia.  She had not started oral iron, because patient notes that she cannot tolerate it due to the associated side effects.  She noted black tarry stools 2 weeks prior. She noted diarrhea alternating with constipation.  She was started on Prilosec 40 mg a day.  Last endoscopic examination was in 2007. She is scheduled for EGD and colonoscopy on 05/11/2018.   CBC on 04/20/2018 revealed a hematocrit of 25.9, hemoglobin 7.2, MCV 68.7, platelets 339,000, and WBC 6800.  Prior CBCs: 08/14/2011:  Hematocrit 33.4, hemoglobin 11.2, and MCV 78. 10/24/2011:  Hematocrit 40.9, hemoglobin 13.5, and MCV 85.3. 10/08/2012:  Hematocrit 36.0, hemoglobin 12.3, and MCV 86. 06/14/2017:  Hematocrit 32.8, hemoglobin 10.4, and MCV 73.3. 04/03/2018:  Hematocrit 25.8, hemoglobin 7.2, MCV 70.5, platelets 305,000, and WBC 7800.  Labs on 04/24/2018 inlcluded a ferritin 3 with an iron saturation of 4% and a TIBC of 560.6.  Stool was guaiac - x 2.  Ferritin has been followed:  4 on 08/14/2011, 133 on 10/24/2011, 125 on 12/17/2011, and 18 on 10/08/2012.  Patient notes  markedly fatigue. She has significant exertional dyspnea. With exertion, her heart will "pound".  Symptoms have "always been there" and she notes that she has "learned to live with her hemoglobin being at about 10".  Her symptomatic anemia has worsened over the course of the last 6 to 9 months. The heart pounding causes patient to "feel panicky". She has "very mild" pleuritic chest pain, which has been intermittent since she diagnosed with pleurisy last year. She has chronic myalgias that she notes "feels like she works out, but really does nothing". Patient having generalized weakness.   Patient has been having low grade temperatures ranging from 99.5 to 100.5. Patient states, "I feel terrible. I have to take Tylenol and Aleve just to get through my days".  She notes that she has been experiencing chills mostly at night. She has had chronic gastrointestinal issues for her entire adult life. She has experienced a recent episode of "black diarrhea". She is being followed by GI Alice Reichert, MD). Patient has urinary frequency, however notes that she drinks a lot of water and teas.   Patient advises that she maintains an adequate appetite. She is eating well. Weight today is 133 lb 8 oz (60.6 kg). Patient eats "the same meal everyday", which consists of spinach salad with grilled chicken. Patient notes that dairy products and eating large meals cause GI upset. Patient states, "I am from [place], so I like to eat meat". She denies significant weight loss in the recent past. No ice pica or significant restless  leg symptoms.   Patient denies pain in the clinic today.   Past Medical History:  Diagnosis Date  . Cancer ([place])    skin  . Fibromyalgia     Past Surgical History:  Procedure Laterality Date  . BREAST BIOPSY Left    core- neg  . CESAREAN SECTION    . ENDOMETRIAL ABLATION      Family History  Problem Relation Age of Onset  . Cancer Mother   . Cancer Maternal Grandfather   . Breast cancer Neg Hx      Social History:  reports that she has never smoked. She does not have any smokeless tobacco history on file. She reports that she does not drink alcohol or use drugs.  Patient does not drink or smoke. She has 2 children (daughter 89, son 91).  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  The patient is accompanied by her mother, who lives in [place],  today.  Allergies: No Known Allergies  Current Medications: Current Outpatient Medications  Medication Sig Dispense Refill  . sertraline (ZOLOFT) 100 MG tablet Take 125 mg by mouth daily.    Marland Kitchen omeprazole (PRILOSEC) 40 MG capsule Take 40 mg by mouth daily.  1   No current facility-administered medications for this visit.     Review of Systems  Constitutional: Positive for fever (day low grade temperatures; 99.5-100.5) and malaise/fatigue (marked). Negative for diaphoresis and weight loss.       "I feel terrible. I have to take Tylenol and Aleve just to get through my days".   HENT: Negative.  Negative for congestion, ear discharge, ear pain, hearing loss, nosebleeds, sinus pain, sore throat and tinnitus.   Eyes: Negative.  Negative for blurred vision, double vision, photophobia, pain, discharge and redness.  Respiratory: Positive for shortness of breath (exertional). Negative for cough, hemoptysis and sputum production.   Cardiovascular: Positive for chest pain (intermittent pleuritic pain for > 1 year; pleurisy Dx in 2018) and palpitations (pounding with exertion). Negative for orthopnea, leg swelling and PND.  Gastrointestinal: Positive for diarrhea (episodes of stool being dark). Negative for abdominal pain, blood in stool, constipation, melena, nausea and vomiting.       Chronic GI issues  Genitourinary: Positive for frequency (drinks a lot of water and tea). Negative for dysuria, hematuria and urgency.  Musculoskeletal: Positive for myalgias. Negative for back pain, falls and joint pain.  Skin: Negative for itching and rash.   Neurological: Positive for dizziness (intermittent), weakness (generalized) and headaches (intermittent). Negative for tremors.  Endo/Heme/Allergies: Does not bruise/bleed easily.  Psychiatric/Behavioral: Negative for depression, memory loss and suicidal ideas. The patient is nervous/anxious. The patient does not have insomnia.   All other systems reviewed and are negative.  Performance status (ECOG): 1 - Symptomatic but completely ambulatory  Vital Signs: BP 115/77 (BP Location: Left Arm, Patient Position: Sitting)   Pulse 76   Temp 97.6 F (36.4 C) (Tympanic)   Resp 18   Ht _0  (1.575 m)   Wt 133 lb 8 oz (60.6 kg)   SpO2 100%   BMI 24.42 kg/m   Physical Exam  Constitutional: She is oriented to person, place, and time and well-developed, well-nourished, and in no distress.  HENT:  Head: Normocephalic and atraumatic.  Brown hair pulled back.  Eyes: Pupils are equal, round, and reactive to light. EOM are normal. No scleral icterus.  Blue eyes.  Neck: Normal range of motion. Neck supple. No tracheal deviation present. No thyromegaly present.  Cardiovascular: Normal rate, regular rhythm and normal heart sounds. Exam reveals no gallop and no friction rub.  No murmur heard. Pulmonary/Chest: Effort normal and breath sounds normal. No respiratory distress. She has no wheezes. She has no rales.  Abdominal: Soft. Bowel sounds are normal. She exhibits no distension. There is no tenderness.  Musculoskeletal: Normal range of motion. She exhibits no edema or tenderness.  Lymphadenopathy:    She has no cervical adenopathy.    She has no axillary adenopathy.       Right: No inguinal and no supraclavicular adenopathy present.       Left: No inguinal and no supraclavicular adenopathy present.  Neurological: She is alert and oriented to person, place, and time.  Skin: Skin is warm and dry. No rash noted. No erythema. There is pallor.  Psychiatric: Mood, affect and judgment normal.  Nursing  note and vitals reviewed.   No visits with results within 3 Day(s) from this visit.  Latest known visit with results is:  Admission on 06/14/2017, Discharged on 06/14/2017  Component Date Value Ref Range Status  . Sodium 06/14/2017 136  135 - 145 mmol/L Final  . Potassium 06/14/2017 3.9  3.5 - 5.1 mmol/L Final  . Chloride 06/14/2017 104  101 - 111 mmol/L Final  . CO2 06/14/2017 22  22 - 32 mmol/L Final  . Glucose, Bld 06/14/2017 112* 65 - 99 mg/dL Final  . BUN 06/14/2017 14  6 - 20 mg/dL Final  . Creatinine, Ser 06/14/2017 0.78  0.44 - 1.00 mg/dL Final  . Calcium 06/14/2017 9.2  8.9 - 10.3 mg/dL Final  . GFR calc non Af Amer 06/14/2017 >60  >60 mL/min Final  . GFR calc Af Amer 06/14/2017 >60  >60 mL/min Final   Comment: (NOTE) The eGFR has been calculated using the CKD EPI equation. This calculation has not been validated in all clinical situations. eGFR's persistently <60 mL/min signify possible Chronic Kidney Disease.   . Anion gap 06/14/2017 10  5 - 15 Final  . WBC 06/14/2017 6.4  3.6 - 11.0 K/uL Final  . RBC 06/14/2017 4.47  3.80 - 5.20 MIL/uL Final  . Hemoglobin 06/14/2017 10.4* 12.0 - 16.0 g/dL Final  . HCT 06/14/2017 32.8* 35.0 - 47.0 % Final  . MCV 06/14/2017 73.3* 80.0 - 100.0 fL Final  . MCH 06/14/2017 23.2* 26.0 - 34.0 pg Final  . MCHC 06/14/2017 31.7* 32.0 - 36.0 g/dL Final  . RDW 06/14/2017 16.3* 11.5 - 14.5 % Final  . Platelets 06/14/2017 278  150 - 440 K/uL Final  . Troponin I 06/14/2017 <0.03  <0.03 ng/mL Final    Assessment:  RONNY RUDDELL is a 42 y.o. female with a long standing history of iron deficiency anemia.  Diet is modest.  She is unable to tolerate oral iron.  She has chronic diarrhea with some dark stools.  She has received IV iron in the past.  Ferritin has been followed:  4 on 08/14/2011, 133 on 10/24/2011, 125 on 12/17/2011, 18 on 10/08/2012, and 3 on 04/24/2018.  She is scheduled for EGD and colonscocopy on 05/11/2018.  Symptomatically, she  has significant symptoms associated with her anemia. She is fatigued, exertionally dyspneic, and having vertigo symptoms. Her heart "pounds" with minimal exertion. She is pale.  Exam reveals no adenopathy or hepatosplenomegaly.  Plan: 1. Labs today: CBC with diff, CMP, ferritin, iron studies, B12, folate, TSH, retic, hold tube, ANA, UA with C&S 2. Iron deficiency anemia  Referral labs indicate severe iron  deficiency anemia. Patient is symptomatic. Will recheck pre-treatment labs today.  Unable to tolerate oral iron due to PMH (+) for significant GI issues. Discussed intravenous iron replacement and patient agrees with plans for infusions.   Preauthorize Venofer - done while patient in clinic.   Discuss possible need for PRBC transfusion depending on CBC today. Patient would rather proceed with iron infusion first.   Venofer 200 mg IV today, then weekly x 3 (total of 4 infusions).  3. RTC in 6 weeks for MD assessment,  labs (CBC with diff, ferritin, B12 - day before), and +/- Venofer.   ADDENDUM:  Labs reviewed.  WBC 4500 ([place] 2700).  Hemoglobin 7.0, hematocrit 23.9, MCV 64.5, and platelets 302,000.  Ferritin 2.  Iron saturation 2% with a TIBC of 499.  Reticulocytes 2.5%.  Patient was contacted and offered blood transfusion, which she accepted.  ABO/Rh and type and screen orders placed.  Patient to return on 05/01/2018 for 1 unit of PRBCs.  Will recheck H&H on 05/06/2018 when patient comes back in for her second Venofer infusion. B12 low at 184. Patient started on oral B12 1,000 mcg supplementation (Rx given 05/01/2018). Anticipate rechecking level at RTC visit in 6 weeks.    Honor Loh, NP  04/30/2018, 9:37 AM   I saw and evaluated the patient, participating in the key portions of the service and reviewing pertinent diagnostic studies and records.  I reviewed the nurse practitioner's note and agree with the findings and the plan.  The assessment and plan were discussed with the patient.   Multiple questions were asked by the patient and answered.   Nolon Stalls, MD 04/30/2018,9:37 AM

## 2018-04-30 NOTE — Progress Notes (Signed)
Pt tolerated infusion well. Pt and VS stable at discharge.  

## 2018-05-01 ENCOUNTER — Encounter: Payer: Self-pay | Admitting: Urgent Care

## 2018-05-01 ENCOUNTER — Inpatient Hospital Stay: Payer: 59

## 2018-05-01 DIAGNOSIS — D509 Iron deficiency anemia, unspecified: Secondary | ICD-10-CM

## 2018-05-01 LAB — ANA W/REFLEX: Anti Nuclear Antibody(ANA): NEGATIVE

## 2018-05-01 MED ORDER — B-12 1000 MCG PO CAPS: 1.0000 | ORAL_CAPSULE | Freq: Every day | ORAL | 3 refills | Status: DC

## 2018-05-01 MED ORDER — SODIUM CHLORIDE 0.9% IV SOLUTION
250.0000 mL | Freq: Once | INTRAVENOUS | Status: AC
  Administered 2018-05-01: 250 mL via INTRAVENOUS
  Filled 2018-05-01: qty 250

## 2018-05-01 MED ORDER — ACETAMINOPHEN 325 MG PO TABS
650.0000 mg | ORAL_TABLET | Freq: Once | ORAL | Status: AC
  Administered 2018-05-01: 650 mg via ORAL
  Filled 2018-05-01: qty 2

## 2018-05-01 MED ORDER — DIPHENHYDRAMINE HCL 25 MG PO CAPS
25.0000 mg | ORAL_CAPSULE | Freq: Once | ORAL | Status: AC
  Administered 2018-05-01: 25 mg via ORAL
  Filled 2018-05-01: qty 1

## 2018-05-02 DIAGNOSIS — E538 Deficiency of other specified B group vitamins: Secondary | ICD-10-CM | POA: Insufficient documentation

## 2018-05-02 LAB — TYPE AND SCREEN
ABO/RH(D): AB POS
Antibody Screen: NEGATIVE
Unit division: 0

## 2018-05-02 LAB — BPAM RBC
Blood Product Expiration Date: 201910142359
ISSUE DATE / TIME: 201909270912
Unit Type and Rh: 6200

## 2018-05-06 ENCOUNTER — Ambulatory Visit: Payer: 59

## 2018-05-06 ENCOUNTER — Other Ambulatory Visit: Payer: 59

## 2018-05-07 ENCOUNTER — Inpatient Hospital Stay: Payer: 59

## 2018-05-07 ENCOUNTER — Inpatient Hospital Stay: Payer: 59 | Attending: Hematology and Oncology

## 2018-05-07 VITALS — BP 114/74 | HR 71 | Temp 96.8°F | Resp 18

## 2018-05-07 DIAGNOSIS — D509 Iron deficiency anemia, unspecified: Secondary | ICD-10-CM | POA: Insufficient documentation

## 2018-05-07 DIAGNOSIS — D500 Iron deficiency anemia secondary to blood loss (chronic): Secondary | ICD-10-CM

## 2018-05-07 DIAGNOSIS — E538 Deficiency of other specified B group vitamins: Secondary | ICD-10-CM | POA: Insufficient documentation

## 2018-05-07 LAB — URINALYSIS, COMPLETE (UACMP) WITH MICROSCOPIC
Bacteria, UA: NONE SEEN
Bilirubin Urine: NEGATIVE
Glucose, UA: NEGATIVE mg/dL
Hgb urine dipstick: NEGATIVE
Ketones, ur: NEGATIVE mg/dL
Leukocytes, UA: NEGATIVE
Nitrite: NEGATIVE
Protein, ur: NEGATIVE mg/dL
RBC / HPF: NONE SEEN RBC/hpf (ref 0–5)
Specific Gravity, Urine: 1.01 (ref 1.005–1.030)
WBC, UA: NONE SEEN WBC/hpf (ref 0–5)
pH: 7 (ref 5.0–8.0)

## 2018-05-07 LAB — HEMOGLOBIN AND HEMATOCRIT, BLOOD
HCT: 29.4 % — ABNORMAL LOW (ref 35.0–47.0)
Hemoglobin: 8.9 g/dL — ABNORMAL LOW (ref 12.0–16.0)

## 2018-05-07 MED ORDER — SODIUM CHLORIDE 0.9 % IV SOLN: 200.0000 mg | Freq: Once | INTRAVENOUS | Status: DC

## 2018-05-07 MED ORDER — SODIUM CHLORIDE 0.9% FLUSH
3.0000 mL | Freq: Once | INTRAVENOUS | Status: DC | PRN
  Filled 2018-05-07: qty 3

## 2018-05-07 MED ORDER — SODIUM CHLORIDE 0.9 % IV SOLN
Freq: Once | INTRAVENOUS | Status: AC
  Administered 2018-05-07: 10:00:00 via INTRAVENOUS
  Filled 2018-05-07: qty 250

## 2018-05-07 MED ORDER — IRON SUCROSE 20 MG/ML IV SOLN
200.0000 mg | Freq: Once | INTRAVENOUS | Status: AC
  Administered 2018-05-07: 200 mg via INTRAVENOUS
  Filled 2018-05-07: qty 10

## 2018-05-07 MED ORDER — SODIUM CHLORIDE 0.9% FLUSH
10.0000 mL | Freq: Once | INTRAVENOUS | Status: DC | PRN
  Filled 2018-05-07: qty 10

## 2018-05-09 LAB — URINE CULTURE: Culture: 50000 — AB

## 2018-05-13 ENCOUNTER — Ambulatory Visit: Payer: 59

## 2018-05-14 ENCOUNTER — Inpatient Hospital Stay: Payer: 59

## 2018-05-14 VITALS — BP 116/73 | HR 64 | Temp 97.5°F | Resp 18

## 2018-05-14 DIAGNOSIS — D500 Iron deficiency anemia secondary to blood loss (chronic): Secondary | ICD-10-CM

## 2018-05-14 DIAGNOSIS — D509 Iron deficiency anemia, unspecified: Secondary | ICD-10-CM | POA: Diagnosis not present

## 2018-05-14 MED ORDER — IRON SUCROSE 20 MG/ML IV SOLN
200.0000 mg | Freq: Once | INTRAVENOUS | Status: AC
  Administered 2018-05-14: 200 mg via INTRAVENOUS
  Filled 2018-05-14: qty 10

## 2018-05-14 MED ORDER — SODIUM CHLORIDE 0.9 % IV SOLN
Freq: Once | INTRAVENOUS | Status: AC
  Administered 2018-05-14: 10:00:00 via INTRAVENOUS
  Filled 2018-05-14: qty 250

## 2018-05-14 MED ORDER — IRON SUCROSE 20 MG/ML IV SOLN: 200.0000 mg | Freq: Once | INTRAVENOUS | Status: DC

## 2018-05-20 ENCOUNTER — Ambulatory Visit: Payer: 59

## 2018-05-21 ENCOUNTER — Inpatient Hospital Stay: Payer: 59

## 2018-05-21 VITALS — BP 109/75 | HR 76 | Temp 96.4°F | Resp 18

## 2018-05-21 DIAGNOSIS — D500 Iron deficiency anemia secondary to blood loss (chronic): Secondary | ICD-10-CM

## 2018-05-21 DIAGNOSIS — D509 Iron deficiency anemia, unspecified: Secondary | ICD-10-CM | POA: Diagnosis not present

## 2018-05-21 MED ORDER — SODIUM CHLORIDE 0.9 % IV SOLN
Freq: Once | INTRAVENOUS | Status: AC
  Administered 2018-05-21: 10:00:00 via INTRAVENOUS
  Filled 2018-05-21: qty 250

## 2018-05-21 MED ORDER — IRON SUCROSE 20 MG/ML IV SOLN
200.0000 mg | Freq: Once | INTRAVENOUS | Status: AC
  Administered 2018-05-21: 200 mg via INTRAVENOUS

## 2018-05-21 NOTE — Patient Instructions (Signed)
Iron Sucrose injection What is this medicine? IRON SUCROSE (AHY ern SOO krohs) is an iron complex. Iron is used to make healthy red blood cells, which carry oxygen and nutrients throughout the body. This medicine is used to treat iron deficiency anemia in people with chronic kidney disease. This medicine may be used for other purposes; ask your health care provider or pharmacist if you have questions. COMMON BRAND NAME(S): Venofer What should I tell my health care provider before I take this medicine? They need to know if you have any of these conditions: -anemia not caused by low iron levels -heart disease -high levels of iron in the blood -kidney disease -liver disease -an unusual or allergic reaction to iron, other medicines, foods, dyes, or preservatives -pregnant or trying to get pregnant -breast-feeding How should I use this medicine? This medicine is for infusion into a vein. It is given by a health care professional in a hospital or clinic setting. Talk to your pediatrician regarding the use of this medicine in children. While this drug may be prescribed for children as young as 2 years for selected conditions, precautions do apply. Overdosage: If you think you have taken too much of this medicine contact a poison control center or emergency room at once. NOTE: This medicine is only for you. Do not share this medicine with others. What if I miss a dose? It is important not to miss your dose. Call your doctor or health care professional if you are unable to keep an appointment. What may interact with this medicine? Do not take this medicine with any of the following medications: -deferoxamine -dimercaprol -other iron products This medicine may also interact with the following medications: -chloramphenicol -deferasirox This list may not describe all possible interactions. Give your health care provider a list of all the medicines, herbs, non-prescription drugs, or dietary  supplements you use. Also tell them if you smoke, drink alcohol, or use illegal drugs. Some items may interact with your medicine. What should I watch for while using this medicine? Visit your doctor or healthcare professional regularly. Tell your doctor or healthcare professional if your symptoms do not start to get better or if they get worse. You may need blood work done while you are taking this medicine. You may need to follow a special diet. Talk to your doctor. Foods that contain iron include: whole grains/cereals, dried fruits, beans, or peas, leafy green vegetables, and organ meats (liver, kidney). What side effects may I notice from receiving this medicine? Side effects that you should report to your doctor or health care professional as soon as possible: -allergic reactions like skin rash, itching or hives, swelling of the face, lips, or tongue -breathing problems -changes in blood pressure -cough -fast, irregular heartbeat -feeling faint or lightheaded, falls -fever or chills -flushing, sweating, or hot feelings -joint or muscle aches/pains -seizures -swelling of the ankles or feet -unusually weak or tired Side effects that usually do not require medical attention (report to your doctor or health care professional if they continue or are bothersome): -diarrhea -feeling achy -headache -irritation at site where injected -nausea, vomiting -stomach upset -tiredness This list may not describe all possible side effects. Call your doctor for medical advice about side effects. You may report side effects to FDA at 1-800-FDA-1088. Where should I keep my medicine? This drug is given in a hospital or clinic and will not be stored at home. NOTE: This sheet is a summary. It may not cover all possible information. If   you have questions about this medicine, talk to your doctor, pharmacist, or health care provider.  2018 Elsevier/Gold Standard (2011-05-02 17:14:35)  

## 2018-06-11 ENCOUNTER — Inpatient Hospital Stay: Payer: 59 | Attending: Hematology and Oncology

## 2018-06-11 DIAGNOSIS — E538 Deficiency of other specified B group vitamins: Secondary | ICD-10-CM | POA: Insufficient documentation

## 2018-06-11 DIAGNOSIS — D509 Iron deficiency anemia, unspecified: Secondary | ICD-10-CM | POA: Insufficient documentation

## 2018-06-11 LAB — CBC WITH DIFFERENTIAL/PLATELET
Band Neutrophils: 1 %
Basophils Absolute: 0 10*3/uL (ref 0.0–0.1)
Basophils Relative: 0 %
Eosinophils Absolute: 0.2 10*3/uL (ref 0.0–0.5)
Eosinophils Relative: 4 %
HCT: 33.6 % — ABNORMAL LOW (ref 36.0–46.0)
Hemoglobin: 10.5 g/dL — ABNORMAL LOW (ref 12.0–15.0)
Lymphocytes Relative: 32 %
Lymphs Abs: 1.4 10*3/uL (ref 0.7–4.0)
MCH: 25.8 pg — ABNORMAL LOW (ref 26.0–34.0)
MCHC: 31.3 g/dL (ref 30.0–36.0)
MCV: 82.6 fL (ref 80.0–100.0)
Metamyelocytes Relative: 2 %
Monocytes Absolute: 0.3 10*3/uL (ref 0.1–1.0)
Monocytes Relative: 6 %
Neutro Abs: 2.6 10*3/uL (ref 1.7–7.7)
Neutrophils Relative %: 55 %
Platelets: 256 10*3/uL (ref 150–400)
RBC: 4.07 MIL/uL (ref 3.87–5.11)
RDW: 22.3 % — ABNORMAL HIGH (ref 11.5–15.5)
WBC: 4.5 10*3/uL (ref 4.0–10.5)
nRBC: 0 % (ref 0.0–0.2)

## 2018-06-11 LAB — VITAMIN B12: Vitamin B-12: 316 pg/mL (ref 180–914)

## 2018-06-11 LAB — FERRITIN: Ferritin: 52 ng/mL (ref 11–307)

## 2018-06-12 ENCOUNTER — Inpatient Hospital Stay: Payer: 59

## 2018-06-12 ENCOUNTER — Inpatient Hospital Stay: Payer: 59 | Attending: Hematology and Oncology | Admitting: Hematology and Oncology

## 2018-06-12 ENCOUNTER — Encounter: Payer: Self-pay | Admitting: Hematology and Oncology

## 2018-06-12 ENCOUNTER — Other Ambulatory Visit: Payer: Self-pay | Admitting: Hematology and Oncology

## 2018-06-12 VITALS — BP 111/77 | HR 74 | Temp 96.2°F | Resp 18

## 2018-06-12 VITALS — BP 115/80 | HR 84 | Temp 97.3°F | Resp 18 | Wt 135.3 lb

## 2018-06-12 DIAGNOSIS — E538 Deficiency of other specified B group vitamins: Secondary | ICD-10-CM

## 2018-06-12 DIAGNOSIS — D500 Iron deficiency anemia secondary to blood loss (chronic): Secondary | ICD-10-CM

## 2018-06-12 DIAGNOSIS — D509 Iron deficiency anemia, unspecified: Secondary | ICD-10-CM

## 2018-06-12 MED ORDER — IRON SUCROSE 20 MG/ML IV SOLN
200.0000 mg | Freq: Once | INTRAVENOUS | Status: AC
  Administered 2018-06-12: 200 mg via INTRAVENOUS
  Filled 2018-06-12: qty 10

## 2018-06-12 MED ORDER — CYANOCOBALAMIN 1000 MCG/ML IJ SOLN
1000.0000 ug | Freq: Once | INTRAMUSCULAR | Status: AC
  Administered 2018-06-12: 1000 ug via INTRAMUSCULAR
  Filled 2018-06-12: qty 1

## 2018-06-12 MED ORDER — SODIUM CHLORIDE 0.9 % IV SOLN
Freq: Once | INTRAVENOUS | Status: AC
  Administered 2018-06-12: 13:00:00 via INTRAVENOUS
  Filled 2018-06-12: qty 250

## 2018-06-12 NOTE — Progress Notes (Signed)
Patient states she is doing better.  No diarrhea in past few weeks.  She also noticed her SOB has improved.  Offers no other complaints today.

## 2018-06-12 NOTE — Progress Notes (Signed)
[facility] day:  06/12/2018  Chief Complaint: WILL SCHIER is a 42 y.o. female with iron deficiency who is seen for 6 weeks assessment after initiation of Venofer.  HPI:  The patient was last seen in the hematology clinic on 04/30/2018 for initial consultation.  She had a long standing history of iron deficiency anemia.  Diet was modest.  She was unable to tolerate oral iron.    EGD and colonoscopy were scheduled.  Symptomatically, she was fatigued, exertionally dyspneic, and having vertigo symptoms. Her heart "pounded" with minimal exertion. She was pale.  Exam revealed no adenopathy or hepatosplenomegaly.  Work-up revealed a hematocrit 23.9, hemoglobin 7.0, and MCV 64.5.  Ferritin was 2.  Iron saturation was 2% with a TIBC of 499.  Reticulocyte count was 2.5%.  TSH was 1.273.  ANA was negative.  B12 was 184.  Folate was 13.5.  Urinalysis on 05/07/2018 revealed no hematuria.    She received 1 unit of PRBCs on 05/01/2018.  She received Venofer weekly x 4 (04/30/2018 - 05/21/2018).  She began oral B12 on 05/01/2018.  She underwent EGD and colonoscopy on 05/11/2018 (no report available).  She is scheduled to have a VCE procedure in the near future.   Labs on 06/11/2018 revealed a hematocrit of 33.6, hemoglobin 10.5, MCV 82.6.  Ferritin was 52.  B12 was 316.  During the interim, patient has improved following her IV iron replacement. She experienced minor headaches after her iron infusions. Energy has improved. She is not as short of breath as she was before. Patient is making a conscious effort to incorporate more iron rich foods into her diet. Patient denies bleeding; no hematochezia, melena, or gross hematuria.  She has not had menstrual cycles since the age of 39; she had a uterine ablation.   She notes that if she eats large amounts of meat, or large meals in general, she experiences diarrhea. Patient advises that she maintains an adequate appetite.  She is eating well. Weight today is 135 lb 5 oz (61.4 kg), which compared to her last visit to the clinic, represents a  2 pound increase.   Patient denies pain in the clinic today.   Past Medical History:  Diagnosis Date  . Cancer ([place])    skin  . Fibromyalgia     Past Surgical History:  Procedure Laterality Date  . BREAST BIOPSY Left    core- neg  . CESAREAN SECTION    . ENDOMETRIAL ABLATION      Family History  Problem Relation Age of Onset  . Cancer Mother   . Cancer Maternal Grandfather   . Breast cancer Neg Hx     Social History:  reports that she has never smoked. She does not have any smokeless tobacco history on file. She reports that she does not drink alcohol or use drugs.  Patient does not drink or smoke. She has 2 children (daughter 15, son 23).  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  The patient is accompanied by her mother, who lives in [place],  today.  Allergies: No Known Allergies  Current Medications: Current Outpatient Medications  Medication Sig Dispense Refill  . Cyanocobalamin (B-12) 1000 MCG CAPS Take 1 capsule by mouth daily. 30 capsule 3  . omeprazole (PRILOSEC) 40 MG capsule Take 40 mg by mouth daily.  1  . sertraline (ZOLOFT) 100 MG tablet Take 125 mg by mouth daily.     No current  facility-administered medications for this visit.    Facility-Administered Medications Ordered in Other Visits  Medication Dose Route Frequency Provider Last Rate Last Dose  . sodium chloride flush (NS) 0.9 % injection 10 mL  10 mL Intracatheter Once PRN Karen Kitchens, NP      . sodium chloride flush (NS) 0.9 % injection 3 mL  3 mL Intracatheter Once PRN Karen Kitchens, NP        Review of Systems  Constitutional: Negative for chills, diaphoresis, fever, malaise/fatigue and weight loss (up 2 pounds).       Feels "fine".  HENT: Negative.  Negative for congestion, ear discharge, ear pain, hearing loss, nosebleeds, sinus pain, sore throat and tinnitus.    Eyes: Negative.  Negative for blurred vision, double vision, photophobia, pain, discharge and redness.  Respiratory: Positive for shortness of breath (improved). Negative for cough, hemoptysis and sputum production.   Cardiovascular: Negative for chest pain, palpitations, leg swelling and PND.  Gastrointestinal: Positive for diarrhea (if eats too much meat). Negative for abdominal pain, blood in stool, constipation, melena, nausea and vomiting.       Chronic GI issues  Genitourinary: Negative for dysuria, frequency and urgency.  Musculoskeletal: Negative for back pain, falls, joint pain, myalgias and neck pain.  Skin: Negative.  Negative for itching and rash.  Neurological: Positive for headaches (headache after infusion relieved by Aleve). Negative for dizziness, tingling, tremors, sensory change, speech change, focal weakness and weakness.  Endo/Heme/Allergies: Does not bruise/bleed easily.  Psychiatric/Behavioral: Negative for depression and memory loss. The patient is not nervous/anxious and does not have insomnia.   All other systems reviewed and are negative.  Performance status (ECOG): 1 - Symptomatic but completely ambulatory  Vital Signs: BP 115/80 (BP Location: Left Arm, Patient Position: Sitting)   Pulse 84   Temp (!) 97.3 F (36.3 C) (Tympanic)   Resp 18   Wt 135 lb 5 oz (61.4 kg)   BMI 24.75 kg/m   Physical Exam  Constitutional: She is oriented to person, place, and time and well-developed, well-nourished, and in no distress. No distress.  HENT:  Head: Normocephalic and atraumatic.  Mouth/Throat: Oropharynx is clear and moist. No oropharyngeal exudate.  Brown hair pulled back.  Eyes: Pupils are equal, round, and reactive to light. Conjunctivae and EOM are normal. No scleral icterus.  Blue eyes.  Neck: Normal range of motion. Neck supple. No JVD present.  Cardiovascular: Normal rate, regular rhythm and normal heart sounds. Exam reveals no gallop and no friction rub.   No murmur heard. Pulmonary/Chest: Effort normal and breath sounds normal. No respiratory distress. She has no wheezes. She has no rales.  Abdominal: Soft. Bowel sounds are normal. She exhibits no distension and no mass. There is no tenderness. There is no rebound and no guarding.  Musculoskeletal: Normal range of motion. She exhibits no edema or tenderness.  Lymphadenopathy:    She has no cervical adenopathy.    She has no axillary adenopathy.       Right: No inguinal and no supraclavicular adenopathy present.       Left: No inguinal and no supraclavicular adenopathy present.  Neurological: She is alert and oriented to person, place, and time. Gait normal.  Skin: Skin is warm and dry. No rash noted. She is not diaphoretic. No erythema. No pallor.  Psychiatric: Mood, affect and judgment normal.  Nursing note and vitals reviewed.   Appointment on 06/11/2018  Component Date Value Ref Range Status  . Ferritin 06/11/2018 52  11 - 307 ng/mL Final   Performed at [facility], [address] [place], [place] [address]  . Vitamin B-12 06/11/2018 316  180 - 914 pg/mL Final   Comment: (NOTE) This assay is not validated for testing neonatal or myeloproliferative syndrome specimens for Vitamin B12 levels. Performed at [facility], [place] [address], [place],  [address]   . WBC 06/11/2018 4.5  4.0 - 10.5 K/uL Final  . RBC 06/11/2018 4.07  3.87 - 5.11 MIL/uL Final  . Hemoglobin 06/11/2018 10.5* 12.0 - 15.0 g/dL Final  . HCT 06/11/2018 33.6* 36.0 - 46.0 % Final  . MCV 06/11/2018 82.6  80.0 - 100.0 fL Final  . MCH 06/11/2018 25.8* 26.0 - 34.0 pg Final  . MCHC 06/11/2018 31.3  30.0 - 36.0 g/dL Final  . RDW 06/11/2018 22.3* 11.5 - 15.5 % Final  . Platelets 06/11/2018 256  150 - 400 K/uL Final  . nRBC 06/11/2018 0.0  0.0 - 0.2 % Final  . Neutrophils Relative % 06/11/2018 55  % Final  . Neutro Abs 06/11/2018 2.6  1.7 - 7.7 K/uL Final  . Band Neutrophils 06/11/2018 1  % Final   . Lymphocytes Relative 06/11/2018 32  % Final  . Lymphs Abs 06/11/2018 1.4  0.7 - 4.0 K/uL Final  . Monocytes Relative 06/11/2018 6  % Final  . Monocytes Absolute 06/11/2018 0.3  0.1 - 1.0 K/uL Final  . Eosinophils Relative 06/11/2018 4  % Final  . Eosinophils Absolute 06/11/2018 0.2  0.0 - 0.5 K/uL Final  . Basophils Relative 06/11/2018 0  % Final  . Basophils Absolute 06/11/2018 0.0  0.0 - 0.1 K/uL Final  . Metamyelocytes Relative 06/11/2018 2  % Final  . Stomatocytes 06/11/2018 PRESENT   Final   Performed at [facility], [address] [place],  [address]    Assessment:  DESHANA ROMINGER is a 42 y.o. female with a long standing history of iron deficiency anemia.  Diet is modest.  She is unable to tolerate oral iron.  She has chronic diarrhea with some dark stools.  Work-up on 04/30/2018 revealed a hematocrit 23.9, hemoglobin 7.0, and MCV 64.5.  Ferritin was 2.  Iron saturation was 2% with a TIBC of 499.  Reticulocyte count was 2.5%.  B12 was 184.  Normal studies included:  TSH, ANA, and folate (13.5).  Urinalysis on 05/07/2018 revealed no hematuria.    She has received IV iron in the past.  She received 1 unit of PRBCs on 05/01/2018.  She received Venofer weekly x 4 (04/30/2018 - 05/21/2018).  Ferritin has been followed:  4 on 08/14/2011, 133 on 10/24/2011, 125 on 12/17/2011, 18 on 10/08/2012, 3 on 04/24/2018, 2 on 04/30/2018, and 52 on 06/11/2018.  She underwent  EGD and colonscocopy on 05/11/2018.  She is scheduled for a capsule enteroscopy.  She has B12 deficiency.  She began oral B12 on 05/01/2018.  B12 was 184 on 04/30/2018 and 316 on 06/11/2018.  Symptomatically, she feels fine.  Shortness of breath has improved.  Hemoglobin is 10.5.  B12 remains low on oral supplementation.  Exam is normal.  Plan: 1. Review labs from yesterday. 2. Iron deficiency anemia:  Patient s/p EGD and colonoscopy.  Need to obtain copy of report.  Capsule study planned for  06/15/2018.  Urinalysis is negative.  She received 4 weeks of IV iron with improvement in her hemoglobin from 7.0 to 10.5.  Discuss additional IV iron (today and in 1 week).  Goal ferritin is 100. 3.   B12 deficiency:  B12 remains low despite oral B12.  Begin B12 injections.  B12 today then weekly x 5 then monthly.  Labs today: anti-parietal cell antibodies and intrinsic factor antibody. 4.  RTC in 8 weeks for MD assessment and labs (CBC with diff, ferritin- day before).   Honor Loh, NP  06/12/2018, 12:24 PM   I saw and evaluated the patient, participating in the key portions of the service and reviewing pertinent diagnostic studies and records.  I reviewed the nurse practitioner's note and agree with the findings and the plan.  The assessment and plan were discussed with the patient. Several questions were asked by the patient and answered.   Nolon Stalls, MD 06/12/2018,12:24 PM

## 2018-06-14 LAB — ANTI-PARIETAL ANTIBODY: Parietal Cell Antibody-IgG: 1 Units (ref 0.0–20.0)

## 2018-06-16 LAB — INTRINSIC FACTOR ANTIBODIES: Intrinsic Factor: 1.1 AU/mL (ref 0.0–1.1)

## 2018-06-23 ENCOUNTER — Inpatient Hospital Stay: Payer: 59

## 2018-06-23 VITALS — BP 113/77 | HR 75 | Temp 95.7°F | Resp 18

## 2018-06-23 DIAGNOSIS — D500 Iron deficiency anemia secondary to blood loss (chronic): Secondary | ICD-10-CM

## 2018-06-23 DIAGNOSIS — D509 Iron deficiency anemia, unspecified: Secondary | ICD-10-CM | POA: Diagnosis not present

## 2018-06-23 MED ORDER — IRON SUCROSE 20 MG/ML IV SOLN
200.0000 mg | Freq: Once | INTRAVENOUS | Status: AC
  Administered 2018-06-23: 200 mg via INTRAVENOUS
  Filled 2018-06-23: qty 10

## 2018-06-23 MED ORDER — SODIUM CHLORIDE 0.9 % IV SOLN
Freq: Once | INTRAVENOUS | Status: AC
  Administered 2018-06-23: 14:00:00 via INTRAVENOUS
  Filled 2018-06-23: qty 250

## 2018-06-23 MED ORDER — CYANOCOBALAMIN 1000 MCG/ML IJ SOLN
1000.0000 ug | Freq: Once | INTRAMUSCULAR | Status: AC
  Administered 2018-06-23: 1000 ug via INTRAMUSCULAR
  Filled 2018-06-23: qty 1

## 2018-06-30 ENCOUNTER — Ambulatory Visit: Payer: 59

## 2018-06-30 ENCOUNTER — Inpatient Hospital Stay: Payer: 59

## 2018-06-30 DIAGNOSIS — D509 Iron deficiency anemia, unspecified: Secondary | ICD-10-CM | POA: Diagnosis not present

## 2018-06-30 DIAGNOSIS — D500 Iron deficiency anemia secondary to blood loss (chronic): Secondary | ICD-10-CM

## 2018-06-30 MED ORDER — CYANOCOBALAMIN 1000 MCG/ML IJ SOLN
1000.0000 ug | Freq: Once | INTRAMUSCULAR | Status: AC
  Administered 2018-06-30: 1000 ug via INTRAMUSCULAR

## 2018-06-30 NOTE — Patient Instructions (Signed)
Cyanocobalamin, Vitamin B12 injection What is this medicine? CYANOCOBALAMIN (sye an oh koe BAL a min) is a man made form of vitamin B12. Vitamin B12 is used in the growth of healthy blood cells, nerve cells, and proteins in the body. It also helps with the metabolism of fats and carbohydrates. This medicine is used to treat people who can not absorb vitamin B12. This medicine may be used for other purposes; ask your health care provider or pharmacist if you have questions. COMMON BRAND NAME(S): B-12 Compliance Kit, B-12 Injection Kit, Cyomin, LA-12, Nutri-Twelve, Physicians EZ Use B-12, Primabalt What should I tell my health care provider before I take this medicine? They need to know if you have any of these conditions: -kidney disease -Leber's disease -megaloblastic anemia -an unusual or allergic reaction to cyanocobalamin, cobalt, other medicines, foods, dyes, or preservatives -pregnant or trying to get pregnant -breast-feeding How should I use this medicine? This medicine is injected into a muscle or deeply under the skin. It is usually given by a health care professional in a clinic or doctor's office. However, your doctor may teach you how to inject yourself. Follow all instructions. Talk to your pediatrician regarding the use of this medicine in children. Special care may be needed. Overdosage: If you think you have taken too much of this medicine contact a poison control center or emergency room at once. NOTE: This medicine is only for you. Do not share this medicine with others. What if I miss a dose? If you are given your dose at a clinic or doctor's office, call to reschedule your appointment. If you give your own injections and you miss a dose, take it as soon as you can. If it is almost time for your next dose, take only that dose. Do not take double or extra doses. What may interact with this medicine? -colchicine -heavy alcohol intake This list may not describe all possible  interactions. Give your health care provider a list of all the medicines, herbs, non-prescription drugs, or dietary supplements you use. Also tell them if you smoke, drink alcohol, or use illegal drugs. Some items may interact with your medicine. What should I watch for while using this medicine? Visit your doctor or health care professional regularly. You may need blood work done while you are taking this medicine. You may need to follow a special diet. Talk to your doctor. Limit your alcohol intake and avoid smoking to get the best benefit. What side effects may I notice from receiving this medicine? Side effects that you should report to your doctor or health care professional as soon as possible: -allergic reactions like skin rash, itching or hives, swelling of the face, lips, or tongue -blue tint to skin -chest tightness, pain -difficulty breathing, wheezing -dizziness -red, swollen painful area on the leg Side effects that usually do not require medical attention (report to your doctor or health care professional if they continue or are bothersome): -diarrhea -headache This list may not describe all possible side effects. Call your doctor for medical advice about side effects. You may report side effects to FDA at 1-800-FDA-1088. Where should I keep my medicine? Keep out of the reach of children. Store at room temperature between 15 and 30 degrees C (59 and 85 degrees F). Protect from light. Throw away any unused medicine after the expiration date. NOTE: This sheet is a summary. It may not cover all possible information. If you have questions about this medicine, talk to your doctor, pharmacist, or  health care provider.  2018 Elsevier/Gold Standard (2007-11-02 22:10:20)

## 2018-07-07 ENCOUNTER — Ambulatory Visit: Payer: 59

## 2018-07-07 ENCOUNTER — Inpatient Hospital Stay: Payer: 59 | Attending: Hematology and Oncology

## 2018-07-07 VITALS — BP 111/76 | HR 76 | Resp 18

## 2018-07-07 DIAGNOSIS — E538 Deficiency of other specified B group vitamins: Secondary | ICD-10-CM | POA: Diagnosis not present

## 2018-07-07 DIAGNOSIS — D500 Iron deficiency anemia secondary to blood loss (chronic): Secondary | ICD-10-CM

## 2018-07-07 MED ORDER — CYANOCOBALAMIN 1000 MCG/ML IJ SOLN
1000.0000 ug | Freq: Once | INTRAMUSCULAR | Status: AC
  Administered 2018-07-07: 1000 ug via INTRAMUSCULAR

## 2018-07-07 NOTE — Patient Instructions (Signed)
Cyanocobalamin, Vitamin B12 injection What is this medicine? CYANOCOBALAMIN (sye an oh koe BAL a min) is a man made form of vitamin B12. Vitamin B12 is used in the growth of healthy blood cells, nerve cells, and proteins in the body. It also helps with the metabolism of fats and carbohydrates. This medicine is used to treat people who can not absorb vitamin B12. This medicine may be used for other purposes; ask your health care provider or pharmacist if you have questions. COMMON BRAND NAME(S): B-12 Compliance Kit, B-12 Injection Kit, Cyomin, LA-12, Nutri-Twelve, Physicians EZ Use B-12, Primabalt What should I tell my health care provider before I take this medicine? They need to know if you have any of these conditions: -kidney disease -Leber's disease -megaloblastic anemia -an unusual or allergic reaction to cyanocobalamin, cobalt, other medicines, foods, dyes, or preservatives -pregnant or trying to get pregnant -breast-feeding How should I use this medicine? This medicine is injected into a muscle or deeply under the skin. It is usually given by a health care professional in a clinic or doctor's office. However, your doctor may teach you how to inject yourself. Follow all instructions. Talk to your pediatrician regarding the use of this medicine in children. Special care may be needed. Overdosage: If you think you have taken too much of this medicine contact a poison control center or emergency room at once. NOTE: This medicine is only for you. Do not share this medicine with others. What if I miss a dose? If you are given your dose at a clinic or doctor's office, call to reschedule your appointment. If you give your own injections and you miss a dose, take it as soon as you can. If it is almost time for your next dose, take only that dose. Do not take double or extra doses. What may interact with this medicine? -colchicine -heavy alcohol intake This list may not describe all possible  interactions. Give your health care provider a list of all the medicines, herbs, non-prescription drugs, or dietary supplements you use. Also tell them if you smoke, drink alcohol, or use illegal drugs. Some items may interact with your medicine. What should I watch for while using this medicine? Visit your doctor or health care professional regularly. You may need blood work done while you are taking this medicine. You may need to follow a special diet. Talk to your doctor. Limit your alcohol intake and avoid smoking to get the best benefit. What side effects may I notice from receiving this medicine? Side effects that you should report to your doctor or health care professional as soon as possible: -allergic reactions like skin rash, itching or hives, swelling of the face, lips, or tongue -blue tint to skin -chest tightness, pain -difficulty breathing, wheezing -dizziness -red, swollen painful area on the leg Side effects that usually do not require medical attention (report to your doctor or health care professional if they continue or are bothersome): -diarrhea -headache This list may not describe all possible side effects. Call your doctor for medical advice about side effects. You may report side effects to FDA at 1-800-FDA-1088. Where should I keep my medicine? Keep out of the reach of children. Store at room temperature between 15 and 30 degrees C (59 and 85 degrees F). Protect from light. Throw away any unused medicine after the expiration date. NOTE: This sheet is a summary. It may not cover all possible information. If you have questions about this medicine, talk to your doctor, pharmacist, or   health care provider.  2018 Elsevier/Gold Standard (2007-11-02 22:10:20)  

## 2018-07-14 ENCOUNTER — Inpatient Hospital Stay: Payer: 59

## 2018-07-14 ENCOUNTER — Ambulatory Visit: Payer: 59

## 2018-07-14 VITALS — BP 116/79 | HR 81 | Temp 96.6°F | Resp 16

## 2018-07-14 DIAGNOSIS — E538 Deficiency of other specified B group vitamins: Secondary | ICD-10-CM | POA: Diagnosis not present

## 2018-07-14 DIAGNOSIS — D500 Iron deficiency anemia secondary to blood loss (chronic): Secondary | ICD-10-CM

## 2018-07-14 MED ORDER — CYANOCOBALAMIN 1000 MCG/ML IJ SOLN
1000.0000 ug | Freq: Once | INTRAMUSCULAR | Status: AC
  Administered 2018-07-14: 1000 ug via INTRAMUSCULAR
  Filled 2018-07-14: qty 1

## 2018-07-14 NOTE — Patient Instructions (Signed)
Cyanocobalamin, Vitamin B12 injection What is this medicine? CYANOCOBALAMIN (sye an oh koe BAL a min) is a man made form of vitamin B12. Vitamin B12 is used in the growth of healthy blood cells, nerve cells, and proteins in the body. It also helps with the metabolism of fats and carbohydrates. This medicine is used to treat people who can not absorb vitamin B12. This medicine may be used for other purposes; ask your health care provider or pharmacist if you have questions. COMMON BRAND NAME(S): B-12 Compliance Kit, B-12 Injection Kit, Cyomin, LA-12, Nutri-Twelve, Physicians EZ Use B-12, Primabalt What should I tell my health care provider before I take this medicine? They need to know if you have any of these conditions: -kidney disease -Leber's disease -megaloblastic anemia -an unusual or allergic reaction to cyanocobalamin, cobalt, other medicines, foods, dyes, or preservatives -pregnant or trying to get pregnant -breast-feeding How should I use this medicine? This medicine is injected into a muscle or deeply under the skin. It is usually given by a health care professional in a clinic or doctor's office. However, your doctor may teach you how to inject yourself. Follow all instructions. Talk to your pediatrician regarding the use of this medicine in children. Special care may be needed. Overdosage: If you think you have taken too much of this medicine contact a poison control center or emergency room at once. NOTE: This medicine is only for you. Do not share this medicine with others. What if I miss a dose? If you are given your dose at a clinic or doctor's office, call to reschedule your appointment. If you give your own injections and you miss a dose, take it as soon as you can. If it is almost time for your next dose, take only that dose. Do not take double or extra doses. What may interact with this medicine? -colchicine -heavy alcohol intake This list may not describe all possible  interactions. Give your health care provider a list of all the medicines, herbs, non-prescription drugs, or dietary supplements you use. Also tell them if you smoke, drink alcohol, or use illegal drugs. Some items may interact with your medicine. What should I watch for while using this medicine? Visit your doctor or health care professional regularly. You may need blood work done while you are taking this medicine. You may need to follow a special diet. Talk to your doctor. Limit your alcohol intake and avoid smoking to get the best benefit. What side effects may I notice from receiving this medicine? Side effects that you should report to your doctor or health care professional as soon as possible: -allergic reactions like skin rash, itching or hives, swelling of the face, lips, or tongue -blue tint to skin -chest tightness, pain -difficulty breathing, wheezing -dizziness -red, swollen painful area on the leg Side effects that usually do not require medical attention (report to your doctor or health care professional if they continue or are bothersome): -diarrhea -headache This list may not describe all possible side effects. Call your doctor for medical advice about side effects. You may report side effects to FDA at 1-800-FDA-1088. Where should I keep my medicine? Keep out of the reach of children. Store at room temperature between 15 and 30 degrees C (59 and 85 degrees F). Protect from light. Throw away any unused medicine after the expiration date. NOTE: This sheet is a summary. It may not cover all possible information. If you have questions about this medicine, talk to your doctor, pharmacist, or   health care provider.  2018 Elsevier/Gold Standard (2007-11-02 22:10:20)  

## 2018-07-21 ENCOUNTER — Inpatient Hospital Stay: Payer: 59

## 2018-07-21 ENCOUNTER — Ambulatory Visit: Payer: 59

## 2018-07-21 VITALS — BP 120/80 | HR 76 | Resp 16

## 2018-07-21 DIAGNOSIS — D500 Iron deficiency anemia secondary to blood loss (chronic): Secondary | ICD-10-CM

## 2018-07-21 DIAGNOSIS — E538 Deficiency of other specified B group vitamins: Secondary | ICD-10-CM | POA: Diagnosis not present

## 2018-07-21 MED ORDER — CYANOCOBALAMIN 1000 MCG/ML IJ SOLN
1000.0000 ug | Freq: Once | INTRAMUSCULAR | Status: AC
  Administered 2018-07-21: 1000 ug via INTRAMUSCULAR
  Filled 2018-07-21: qty 1

## 2018-07-21 NOTE — Patient Instructions (Signed)
Cyanocobalamin, Vitamin B12 injection What is this medicine? CYANOCOBALAMIN (sye an oh koe BAL a min) is a man made form of vitamin B12. Vitamin B12 is used in the growth of healthy blood cells, nerve cells, and proteins in the body. It also helps with the metabolism of fats and carbohydrates. This medicine is used to treat people who can not absorb vitamin B12. This medicine may be used for other purposes; ask your health care provider or pharmacist if you have questions. COMMON BRAND NAME(S): B-12 Compliance Kit, B-12 Injection Kit, Cyomin, LA-12, Nutri-Twelve, Physicians EZ Use B-12, Primabalt What should I tell my health care provider before I take this medicine? They need to know if you have any of these conditions: -kidney disease -Leber's disease -megaloblastic anemia -an unusual or allergic reaction to cyanocobalamin, cobalt, other medicines, foods, dyes, or preservatives -pregnant or trying to get pregnant -breast-feeding How should I use this medicine? This medicine is injected into a muscle or deeply under the skin. It is usually given by a health care professional in a clinic or doctor's office. However, your doctor may teach you how to inject yourself. Follow all instructions. Talk to your pediatrician regarding the use of this medicine in children. Special care may be needed. Overdosage: If you think you have taken too much of this medicine contact a poison control center or emergency room at once. NOTE: This medicine is only for you. Do not share this medicine with others. What if I miss a dose? If you are given your dose at a clinic or doctor's office, call to reschedule your appointment. If you give your own injections and you miss a dose, take it as soon as you can. If it is almost time for your next dose, take only that dose. Do not take double or extra doses. What may interact with this medicine? -colchicine -heavy alcohol intake This list may not describe all possible  interactions. Give your health care provider a list of all the medicines, herbs, non-prescription drugs, or dietary supplements you use. Also tell them if you smoke, drink alcohol, or use illegal drugs. Some items may interact with your medicine. What should I watch for while using this medicine? Visit your doctor or health care professional regularly. You may need blood work done while you are taking this medicine. You may need to follow a special diet. Talk to your doctor. Limit your alcohol intake and avoid smoking to get the best benefit. What side effects may I notice from receiving this medicine? Side effects that you should report to your doctor or health care professional as soon as possible: -allergic reactions like skin rash, itching or hives, swelling of the face, lips, or tongue -blue tint to skin -chest tightness, pain -difficulty breathing, wheezing -dizziness -red, swollen painful area on the leg Side effects that usually do not require medical attention (report to your doctor or health care professional if they continue or are bothersome): -diarrhea -headache This list may not describe all possible side effects. Call your doctor for medical advice about side effects. You may report side effects to FDA at 1-800-FDA-1088. Where should I keep my medicine? Keep out of the reach of children. Store at room temperature between 15 and 30 degrees C (59 and 85 degrees F). Protect from light. Throw away any unused medicine after the expiration date. NOTE: This sheet is a summary. It may not cover all possible information. If you have questions about this medicine, talk to your doctor, pharmacist, or   health care provider.  2018 Elsevier/Gold Standard (2007-11-02 22:10:20)  

## 2018-08-04 ENCOUNTER — Other Ambulatory Visit: Payer: Self-pay

## 2018-08-04 DIAGNOSIS — D500 Iron deficiency anemia secondary to blood loss (chronic): Secondary | ICD-10-CM

## 2018-08-11 ENCOUNTER — Inpatient Hospital Stay: Payer: 59 | Attending: Hematology and Oncology

## 2018-08-11 DIAGNOSIS — D509 Iron deficiency anemia, unspecified: Secondary | ICD-10-CM | POA: Insufficient documentation

## 2018-08-11 DIAGNOSIS — K2970 Gastritis, unspecified, without bleeding: Secondary | ICD-10-CM | POA: Insufficient documentation

## 2018-08-11 DIAGNOSIS — E538 Deficiency of other specified B group vitamins: Secondary | ICD-10-CM | POA: Insufficient documentation

## 2018-08-11 DIAGNOSIS — D500 Iron deficiency anemia secondary to blood loss (chronic): Secondary | ICD-10-CM

## 2018-08-11 LAB — CBC WITH DIFFERENTIAL/PLATELET
Abs Immature Granulocytes: 0.02 10*3/uL (ref 0.00–0.07)
Basophils Absolute: 0 10*3/uL (ref 0.0–0.1)
Basophils Relative: 0 %
Eosinophils Absolute: 0.1 10*3/uL (ref 0.0–0.5)
Eosinophils Relative: 2 %
HCT: 37.7 % (ref 36.0–46.0)
Hemoglobin: 12.6 g/dL (ref 12.0–15.0)
Immature Granulocytes: 0 %
Lymphocytes Relative: 27 %
Lymphs Abs: 1.5 10*3/uL (ref 0.7–4.0)
MCH: 29.3 pg (ref 26.0–34.0)
MCHC: 33.4 g/dL (ref 30.0–36.0)
MCV: 87.7 fL (ref 80.0–100.0)
Monocytes Absolute: 0.6 10*3/uL (ref 0.1–1.0)
Monocytes Relative: 10 %
Neutro Abs: 3.4 10*3/uL (ref 1.7–7.7)
Neutrophils Relative %: 61 %
Platelets: 235 10*3/uL (ref 150–400)
RBC: 4.3 MIL/uL (ref 3.87–5.11)
RDW: 12.5 % (ref 11.5–15.5)
WBC: 5.6 10*3/uL (ref 4.0–10.5)
nRBC: 0 % (ref 0.0–0.2)

## 2018-08-12 ENCOUNTER — Telehealth: Payer: Self-pay

## 2018-08-12 ENCOUNTER — Encounter: Payer: Self-pay | Admitting: Hematology and Oncology

## 2018-08-12 ENCOUNTER — Inpatient Hospital Stay (HOSPITAL_BASED_OUTPATIENT_CLINIC_OR_DEPARTMENT_OTHER): Payer: 59 | Admitting: Hematology and Oncology

## 2018-08-12 ENCOUNTER — Other Ambulatory Visit: Payer: Self-pay

## 2018-08-12 VITALS — BP 127/86 | HR 80 | Temp 97.5°F | Resp 18 | Wt 141.0 lb

## 2018-08-12 DIAGNOSIS — K2970 Gastritis, unspecified, without bleeding: Secondary | ICD-10-CM

## 2018-08-12 DIAGNOSIS — E538 Deficiency of other specified B group vitamins: Secondary | ICD-10-CM

## 2018-08-12 DIAGNOSIS — D509 Iron deficiency anemia, unspecified: Secondary | ICD-10-CM | POA: Diagnosis not present

## 2018-08-12 DIAGNOSIS — D500 Iron deficiency anemia secondary to blood loss (chronic): Secondary | ICD-10-CM

## 2018-08-12 LAB — FERRITIN: Ferritin: 63 ng/mL (ref 11–307)

## 2018-08-12 NOTE — Telephone Encounter (Signed)
faxed over requested form for results for EGD/Colon/VCE conformation fax confirmed. I will follow up with the office in 2 days if haven't recevied results.

## 2018-08-12 NOTE — Progress Notes (Signed)
[facility] day:  08/12/2018  Chief Complaint: Alicia Washington is a 43 y.o. female with iron deficiency and B12 deficiency who is seen for 2 month assessment.  HPI:  The patient was last seen in the hematology clinic on 06/12/2018.  At that time, she felt fine.  Shortness of breath had improved.  Hemoglobin was 10.5.  B12 remained low on oral supplementation.  Exam was normal.  She received Venofer x 2 (06/12/2018 and 06/23/2018).  She received B12 weekly x 6 (06/12/2018 - 07/21/2018).  CBC on 08/11/2018 revealed a hematocrit of 37.7, hemoglobin 12.6, MCV 87.7, platelets 235,000, WBC 5600 with an ANC of 3400.  Ferritin was 63.  During the interim, she has felt better with more energy.  She denies any shortness of breath except with exercise.  She notes nausea 1x/week.  She denies any melena, hematochezia or hematuria.   Past Medical History:  Diagnosis Date  . Cancer ([place])    skin  . Fibromyalgia     Past Surgical History:  Procedure Laterality Date  . BREAST BIOPSY Left    core- neg  . CESAREAN SECTION    . ENDOMETRIAL ABLATION      Family History  Problem Relation Age of Onset  . Cancer Mother   . Cancer Maternal Grandfather   . Breast cancer Neg Hx     Social History:  reports that she has never smoked. She does not have any smokeless tobacco history on file. She reports that she does not drink alcohol or use drugs.  Patient does not drink or smoke. She has 2 children (daughter 79, son 71).  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  The patient is accompanied by her mother, who lives in [place],  today.  Allergies: No Known Allergies  Current Medications: Current Outpatient Medications  Medication Sig Dispense Refill  . Cyanocobalamin 1000 MCG/ML KIT Inject 1,000 mcg as directed.    . sertraline (ZOLOFT) 100 MG tablet Take 125 mg by mouth daily.    Marland Kitchen omeprazole (PRILOSEC) 40 MG capsule Take 40 mg by mouth daily.  1    No current facility-administered medications for this visit.    Facility-Administered Medications Ordered in Other Visits  Medication Dose Route Frequency Provider Last Rate Last Dose  . sodium chloride flush (NS) 0.9 % injection 10 mL  10 mL Intracatheter Once PRN Karen Kitchens, NP      . sodium chloride flush (NS) 0.9 % injection 3 mL  3 mL Intracatheter Once PRN Karen Kitchens, NP        Review of Systems  Constitutional: Negative.  Negative for chills, diaphoresis, fever, malaise/fatigue and weight loss (up 5 pounds).       Feels better, more energy.  HENT: Negative.  Negative for congestion, ear discharge, ear pain, hearing loss, nosebleeds, sinus pain, sore throat and tinnitus.   Eyes: Negative.  Negative for blurred vision, double vision, photophobia, pain, discharge and redness.  Respiratory: Positive for shortness of breath (only with exercise). Negative for cough, hemoptysis and sputum production.   Cardiovascular: Negative.  Negative for chest pain, palpitations, leg swelling and PND.  Gastrointestinal: Negative for abdominal pain, blood in stool, constipation, diarrhea, melena, nausea and vomiting.       Chronic GI issues.  Genitourinary: Negative.  Negative for frequency, hematuria and urgency.  Musculoskeletal: Negative.  Negative for back pain, falls, joint pain and myalgias.  Skin: Negative.  Negative  for itching and rash.  Neurological: Negative for dizziness, tingling, tremors, sensory change, speech change, focal weakness, weakness and headaches.  Endo/Heme/Allergies: Negative.  Does not bruise/bleed easily.  Psychiatric/Behavioral: Negative.  Negative for depression and memory loss. The patient is not nervous/anxious and does not have insomnia.   All other systems reviewed and are negative.  Performance status (ECOG): 1  Vital Signs: BP 127/86 (BP Location: Left Arm, Patient Position: Sitting)   Pulse 80   Temp (!) 97.5 F (36.4 C) (Tympanic)   Resp 18   Wt 140  lb 15.8 oz (64 kg)   BMI 25.79 kg/m   Physical Exam  Constitutional: She is oriented to person, place, and time and well-developed, well-nourished, and in no distress. No distress.  HENT:  Head: Normocephalic and atraumatic.  Mouth/Throat: Oropharynx is clear and moist. No oropharyngeal exudate.  Brown hair pulled back.  Eyes: Pupils are equal, round, and reactive to light. Conjunctivae and EOM are normal. No scleral icterus.  Blue eyes.  Neck: Normal range of motion. Neck supple. No JVD present.  Cardiovascular: Normal rate, regular rhythm and normal heart sounds. Exam reveals no gallop and no friction rub.  No murmur heard. Pulmonary/Chest: Effort normal and breath sounds normal. No respiratory distress. She has no wheezes. She has no rales.  Abdominal: Soft. Bowel sounds are normal. She exhibits no distension and no mass. There is no abdominal tenderness. There is no rebound and no guarding.  Musculoskeletal: Normal range of motion.        General: No tenderness or edema.  Lymphadenopathy:    She has no cervical adenopathy.    She has no axillary adenopathy.       Right: No supraclavicular adenopathy present.       Left: No supraclavicular adenopathy present.  Neurological: She is alert and oriented to person, place, and time. Gait normal.  Skin: Skin is warm and dry. No rash noted. She is not diaphoretic. No erythema. No pallor.  Psychiatric: Mood, affect and judgment normal.  Nursing note and vitals reviewed.   Appointment on 08/11/2018  Component Date Value Ref Range Status  . Ferritin 08/11/2018 63  11 - 307 ng/mL Final   Performed at [facility], [address] [place], [place] [address]  . WBC 08/11/2018 5.6  4.0 - 10.5 K/uL Final  . RBC 08/11/2018 4.30  3.87 - 5.11 MIL/uL Final  . Hemoglobin 08/11/2018 12.6  12.0 - 15.0 g/dL Final  . HCT 08/11/2018 37.7  36.0 - 46.0 % Final  . MCV 08/11/2018 87.7  80.0 - 100.0 fL Final  . MCH 08/11/2018 29.3  26.0 - 34.0  pg Final  . MCHC 08/11/2018 33.4  30.0 - 36.0 g/dL Final  . RDW 08/11/2018 12.5  11.5 - 15.5 % Final  . Platelets 08/11/2018 235  150 - 400 K/uL Final  . nRBC 08/11/2018 0.0  0.0 - 0.2 % Final  . Neutrophils Relative % 08/11/2018 61  % Final  . Neutro Abs 08/11/2018 3.4  1.7 - 7.7 K/uL Final  . Lymphocytes Relative 08/11/2018 27  % Final  . Lymphs Abs 08/11/2018 1.5  0.7 - 4.0 K/uL Final  . Monocytes Relative 08/11/2018 10  % Final  . Monocytes Absolute 08/11/2018 0.6  0.1 - 1.0 K/uL Final  . Eosinophils Relative 08/11/2018 2  % Final  . Eosinophils Absolute 08/11/2018 0.1  0.0 - 0.5 K/uL Final  . Basophils Relative 08/11/2018 0  % Final  . Basophils Absolute 08/11/2018 0.0  0.0 - 0.1 K/uL Final  . Immature Granulocytes 08/11/2018 0  % Final  . Abs Immature Granulocytes 08/11/2018 0.02  0.00 - 0.07 K/uL Final   Performed at [facility], [address] [place], [place] [address]    Assessment:  Alicia Washington is a 43 y.o. female with a long standing history of iron deficiency anemia.  Diet is modest.  She is unable to tolerate oral iron.  She has chronic diarrhea with some dark stools.  Work-up on 04/30/2018 revealed a hematocrit 23.9, hemoglobin 7.0, and MCV 64.5.  Ferritin was 2.  Iron saturation was 2% with a TIBC of 499.  Reticulocyte count was 2.5%.  B12 was 184.  Normal studies included:  TSH, ANA, and folate (13.5).  Urinalysis on 05/07/2018 revealed no hematuria.    She has received IV iron in the past.  She received 1 unit of PRBCs on 05/01/2018.  She received Venofer weekly x 4 (04/30/2018 - 05/21/2018) and x 2 (06/12/2018 and 06/23/2018).  Ferritin has been followed:  4 on 08/14/2011, 133 on 10/24/2011, 125 on 12/17/2011, 18 on 10/08/2012, 3 on 04/24/2018, 2 on 04/30/2018, 52 on 06/11/2018, and 63 on 08/11/2018.  EGD on 05/11/2018 revealed erythematous mucosa in the antrum (minmal chronic gastritis and mild foveolar hyperplasia) and a 1 cm hiatal hernia.   Colonoscopy on 05/11/2018 was normal.  Random biopsies revealed no abnormalities.  Capsule enteroscopy on 06/15/2018 was normal.  She has B12 deficiency.  Intrinsic factor and anti-parietal cell antibody were normal on 06/12/2018.  She began oral B12 on 05/01/2018.  B12 was 184 on 04/30/2018 and 316 on 06/11/2018. She received B12 injections weekly x 6 (06/12/2018 - 07/21/2018).  Symptomatically, she is feeling better.  Exam is unremarkable.  Hemoglobin is 12.6.    Plan: 1. Review labs from yesterday. 2. Iron deficiency anemia  EGD revealed mild gastritis.  Colonoscopy and capsule study were normal.  Urinalysis was negative.   Ferritin is 63. 3.   B12 deficiency  Continue B12 injections monthly.  B12 on 08/18/2018, then monthly x 4. 4.   Check labs (CBC with diff and B12) prior to next B12 injection (09/22/2018)  5.   RTC in 3 months for MD assessment, labs (CBC with diff, ferritin- day before), B12 injection, and +/- Venofer.   Addendum:  Copies of her EGD and colonoscopy performed by Dr. Madolyn Frieze on 05/11/2018 were obtained after clinic.  Pathology reports were also obtained.  Capsule study report was also obtained.   Lequita Asal, MD  08/12/2018, 4:35 PM

## 2018-08-12 NOTE — Progress Notes (Signed)
Here for follow up. Stated she is feeling " better " pulse ox 100 % on RA . Stated she has sob if she walks longer periods and climbs stairs ( improved per pt )

## 2018-08-18 ENCOUNTER — Ambulatory Visit: Payer: 59

## 2018-08-18 ENCOUNTER — Other Ambulatory Visit: Payer: Self-pay | Admitting: Urgent Care

## 2018-08-18 ENCOUNTER — Inpatient Hospital Stay: Payer: 59

## 2018-08-18 DIAGNOSIS — D500 Iron deficiency anemia secondary to blood loss (chronic): Secondary | ICD-10-CM

## 2018-08-18 DIAGNOSIS — E538 Deficiency of other specified B group vitamins: Secondary | ICD-10-CM | POA: Diagnosis not present

## 2018-08-18 MED ORDER — CYANOCOBALAMIN 1000 MCG/ML IJ SOLN
1000.0000 ug | Freq: Once | INTRAMUSCULAR | Status: AC
  Administered 2018-08-18: 1000 ug via INTRAMUSCULAR

## 2018-08-18 NOTE — Patient Instructions (Signed)
Cyanocobalamin, Vitamin B12 injection What is this medicine? CYANOCOBALAMIN (sye an oh koe BAL a min) is a man made form of vitamin B12. Vitamin B12 is used in the growth of healthy blood cells, nerve cells, and proteins in the body. It also helps with the metabolism of fats and carbohydrates. This medicine is used to treat people who can not absorb vitamin B12. This medicine may be used for other purposes; ask your health care provider or pharmacist if you have questions. COMMON BRAND NAME(S): B-12 Compliance Kit, B-12 Injection Kit, Cyomin, LA-12, Nutri-Twelve, Physicians EZ Use B-12, Primabalt What should I tell my health care provider before I take this medicine? They need to know if you have any of these conditions: -kidney disease -Leber's disease -megaloblastic anemia -an unusual or allergic reaction to cyanocobalamin, cobalt, other medicines, foods, dyes, or preservatives -pregnant or trying to get pregnant -breast-feeding How should I use this medicine? This medicine is injected into a muscle or deeply under the skin. It is usually given by a health care professional in a clinic or doctor's office. However, your doctor may teach you how to inject yourself. Follow all instructions. Talk to your pediatrician regarding the use of this medicine in children. Special care may be needed. Overdosage: If you think you have taken too much of this medicine contact a poison control center or emergency room at once. NOTE: This medicine is only for you. Do not share this medicine with others. What if I miss a dose? If you are given your dose at a clinic or doctor's office, call to reschedule your appointment. If you give your own injections and you miss a dose, take it as soon as you can. If it is almost time for your next dose, take only that dose. Do not take double or extra doses. What may interact with this medicine? -colchicine -heavy alcohol intake This list may not describe all possible  interactions. Give your health care provider a list of all the medicines, herbs, non-prescription drugs, or dietary supplements you use. Also tell them if you smoke, drink alcohol, or use illegal drugs. Some items may interact with your medicine. What should I watch for while using this medicine? Visit your doctor or health care professional regularly. You may need blood work done while you are taking this medicine. You may need to follow a special diet. Talk to your doctor. Limit your alcohol intake and avoid smoking to get the best benefit. What side effects may I notice from receiving this medicine? Side effects that you should report to your doctor or health care professional as soon as possible: -allergic reactions like skin rash, itching or hives, swelling of the face, lips, or tongue -blue tint to skin -chest tightness, pain -difficulty breathing, wheezing -dizziness -red, swollen painful area on the leg Side effects that usually do not require medical attention (report to your doctor or health care professional if they continue or are bothersome): -diarrhea -headache This list may not describe all possible side effects. Call your doctor for medical advice about side effects. You may report side effects to FDA at 1-800-FDA-1088. Where should I keep my medicine? Keep out of the reach of children. Store at room temperature between 15 and 30 degrees C (59 and 85 degrees F). Protect from light. Throw away any unused medicine after the expiration date. NOTE: This sheet is a summary. It may not cover all possible information. If you have questions about this medicine, talk to your doctor, pharmacist, or   health care provider.  2019 Elsevier/Gold Standard (2007-11-02 22:10:20)  

## 2018-09-15 ENCOUNTER — Ambulatory Visit: Payer: 59

## 2018-09-15 ENCOUNTER — Other Ambulatory Visit: Payer: 59

## 2018-09-22 ENCOUNTER — Inpatient Hospital Stay: Payer: 59 | Attending: Hematology and Oncology

## 2018-09-22 ENCOUNTER — Inpatient Hospital Stay: Payer: 59

## 2018-09-22 VITALS — BP 110/77 | HR 72 | Temp 95.4°F | Resp 16

## 2018-09-22 DIAGNOSIS — D500 Iron deficiency anemia secondary to blood loss (chronic): Secondary | ICD-10-CM

## 2018-09-22 DIAGNOSIS — E538 Deficiency of other specified B group vitamins: Secondary | ICD-10-CM | POA: Insufficient documentation

## 2018-09-22 LAB — CBC WITH DIFFERENTIAL/PLATELET
Abs Immature Granulocytes: 0.03 10*3/uL (ref 0.00–0.07)
Basophils Absolute: 0 10*3/uL (ref 0.0–0.1)
Basophils Relative: 1 %
Eosinophils Absolute: 0.1 10*3/uL (ref 0.0–0.5)
Eosinophils Relative: 2 %
HCT: 37.8 % (ref 36.0–46.0)
Hemoglobin: 12.6 g/dL (ref 12.0–15.0)
Immature Granulocytes: 1 %
Lymphocytes Relative: 25 %
Lymphs Abs: 1.3 10*3/uL (ref 0.7–4.0)
MCH: 29.4 pg (ref 26.0–34.0)
MCHC: 33.3 g/dL (ref 30.0–36.0)
MCV: 88.1 fL (ref 80.0–100.0)
Monocytes Absolute: 0.4 10*3/uL (ref 0.1–1.0)
Monocytes Relative: 8 %
Neutro Abs: 3.4 10*3/uL (ref 1.7–7.7)
Neutrophils Relative %: 63 %
Platelets: 245 10*3/uL (ref 150–400)
RBC: 4.29 MIL/uL (ref 3.87–5.11)
RDW: 13.2 % (ref 11.5–15.5)
WBC: 5.2 10*3/uL (ref 4.0–10.5)
nRBC: 0 % (ref 0.0–0.2)

## 2018-09-22 MED ORDER — CYANOCOBALAMIN 1000 MCG/ML IJ SOLN
1000.0000 ug | Freq: Once | INTRAMUSCULAR | Status: AC
  Administered 2018-09-22: 1000 ug via INTRAMUSCULAR

## 2018-09-22 NOTE — Patient Instructions (Signed)
Cyanocobalamin, Vitamin B12 injection What is this medicine? CYANOCOBALAMIN (sye an oh koe BAL a min) is a man made form of vitamin B12. Vitamin B12 is used in the growth of healthy blood cells, nerve cells, and proteins in the body. It also helps with the metabolism of fats and carbohydrates. This medicine is used to treat people who can not absorb vitamin B12. This medicine may be used for other purposes; ask your health care provider or pharmacist if you have questions. COMMON BRAND NAME(S): B-12 Compliance Kit, B-12 Injection Kit, Cyomin, LA-12, Nutri-Twelve, Physicians EZ Use B-12, Primabalt What should I tell my health care provider before I take this medicine? They need to know if you have any of these conditions: -kidney disease -Leber's disease -megaloblastic anemia -an unusual or allergic reaction to cyanocobalamin, cobalt, other medicines, foods, dyes, or preservatives -pregnant or trying to get pregnant -breast-feeding How should I use this medicine? This medicine is injected into a muscle or deeply under the skin. It is usually given by a health care professional in a clinic or doctor's office. However, your doctor may teach you how to inject yourself. Follow all instructions. Talk to your pediatrician regarding the use of this medicine in children. Special care may be needed. Overdosage: If you think you have taken too much of this medicine contact a poison control center or emergency room at once. NOTE: This medicine is only for you. Do not share this medicine with others. What if I miss a dose? If you are given your dose at a clinic or doctor's office, call to reschedule your appointment. If you give your own injections and you miss a dose, take it as soon as you can. If it is almost time for your next dose, take only that dose. Do not take double or extra doses. What may interact with this medicine? -colchicine -heavy alcohol intake This list may not describe all possible  interactions. Give your health care provider a list of all the medicines, herbs, non-prescription drugs, or dietary supplements you use. Also tell them if you smoke, drink alcohol, or use illegal drugs. Some items may interact with your medicine. What should I watch for while using this medicine? Visit your doctor or health care professional regularly. You may need blood work done while you are taking this medicine. You may need to follow a special diet. Talk to your doctor. Limit your alcohol intake and avoid smoking to get the best benefit. What side effects may I notice from receiving this medicine? Side effects that you should report to your doctor or health care professional as soon as possible: -allergic reactions like skin rash, itching or hives, swelling of the face, lips, or tongue -blue tint to skin -chest tightness, pain -difficulty breathing, wheezing -dizziness -red, swollen painful area on the leg Side effects that usually do not require medical attention (report to your doctor or health care professional if they continue or are bothersome): -diarrhea -headache This list may not describe all possible side effects. Call your doctor for medical advice about side effects. You may report side effects to FDA at 1-800-FDA-1088. Where should I keep my medicine? Keep out of the reach of children. Store at room temperature between 15 and 30 degrees C (59 and 85 degrees F). Protect from light. Throw away any unused medicine after the expiration date. NOTE: This sheet is a summary. It may not cover all possible information. If you have questions about this medicine, talk to your doctor, pharmacist, or   health care provider.  2019 Elsevier/Gold Standard (2007-11-02 22:10:20)  

## 2018-09-24 LAB — VITAMIN B12: Vitamin B-12: 380 pg/mL (ref 180–914)

## 2018-10-09 ENCOUNTER — Encounter: Payer: Self-pay | Admitting: Internal Medicine

## 2018-10-20 ENCOUNTER — Inpatient Hospital Stay: Payer: 59 | Attending: Hematology and Oncology

## 2018-10-20 ENCOUNTER — Other Ambulatory Visit: Payer: Self-pay

## 2018-10-20 VITALS — BP 117/79 | HR 72 | Temp 97.8°F

## 2018-10-20 DIAGNOSIS — D509 Iron deficiency anemia, unspecified: Secondary | ICD-10-CM | POA: Diagnosis not present

## 2018-10-20 DIAGNOSIS — E538 Deficiency of other specified B group vitamins: Secondary | ICD-10-CM | POA: Insufficient documentation

## 2018-10-20 DIAGNOSIS — D500 Iron deficiency anemia secondary to blood loss (chronic): Secondary | ICD-10-CM

## 2018-10-20 MED ORDER — CYANOCOBALAMIN 1000 MCG/ML IJ SOLN
1000.0000 ug | Freq: Once | INTRAMUSCULAR | Status: AC
  Administered 2018-10-20: 1000 ug via INTRAMUSCULAR

## 2018-11-16 ENCOUNTER — Inpatient Hospital Stay: Payer: 59 | Attending: Hematology and Oncology

## 2018-11-16 ENCOUNTER — Other Ambulatory Visit: Payer: Self-pay

## 2018-11-16 ENCOUNTER — Telehealth: Payer: Self-pay | Admitting: Hematology and Oncology

## 2018-11-16 DIAGNOSIS — D500 Iron deficiency anemia secondary to blood loss (chronic): Secondary | ICD-10-CM | POA: Diagnosis not present

## 2018-11-16 DIAGNOSIS — Z79899 Other long term (current) drug therapy: Secondary | ICD-10-CM | POA: Insufficient documentation

## 2018-11-16 DIAGNOSIS — E538 Deficiency of other specified B group vitamins: Secondary | ICD-10-CM | POA: Diagnosis not present

## 2018-11-16 DIAGNOSIS — M797 Fibromyalgia: Secondary | ICD-10-CM | POA: Insufficient documentation

## 2018-11-16 LAB — CBC WITH DIFFERENTIAL/PLATELET
Abs Immature Granulocytes: 0.04 10*3/uL (ref 0.00–0.07)
Basophils Absolute: 0 10*3/uL (ref 0.0–0.1)
Basophils Relative: 1 %
Eosinophils Absolute: 0.1 10*3/uL (ref 0.0–0.5)
Eosinophils Relative: 2 %
HCT: 41 % (ref 36.0–46.0)
Hemoglobin: 13.7 g/dL (ref 12.0–15.0)
Immature Granulocytes: 1 %
Lymphocytes Relative: 24 %
Lymphs Abs: 1.5 10*3/uL (ref 0.7–4.0)
MCH: 29.7 pg (ref 26.0–34.0)
MCHC: 33.4 g/dL (ref 30.0–36.0)
MCV: 88.9 fL (ref 80.0–100.0)
Monocytes Absolute: 0.6 10*3/uL (ref 0.1–1.0)
Monocytes Relative: 9 %
Neutro Abs: 3.9 10*3/uL (ref 1.7–7.7)
Neutrophils Relative %: 63 %
Platelets: 239 10*3/uL (ref 150–400)
RBC: 4.61 MIL/uL (ref 3.87–5.11)
RDW: 12.6 % (ref 11.5–15.5)
WBC: 6.2 10*3/uL (ref 4.0–10.5)
nRBC: 0 % (ref 0.0–0.2)

## 2018-11-16 LAB — FERRITIN: Ferritin: 33 ng/mL (ref 11–307)

## 2018-11-17 ENCOUNTER — Inpatient Hospital Stay (HOSPITAL_BASED_OUTPATIENT_CLINIC_OR_DEPARTMENT_OTHER): Payer: 59 | Admitting: Hematology and Oncology

## 2018-11-17 ENCOUNTER — Encounter: Payer: Self-pay | Admitting: Hematology and Oncology

## 2018-11-17 ENCOUNTER — Ambulatory Visit: Payer: 59

## 2018-11-17 DIAGNOSIS — D509 Iron deficiency anemia, unspecified: Secondary | ICD-10-CM | POA: Diagnosis not present

## 2018-11-17 DIAGNOSIS — E538 Deficiency of other specified B group vitamins: Secondary | ICD-10-CM | POA: Diagnosis not present

## 2018-11-17 DIAGNOSIS — D500 Iron deficiency anemia secondary to blood loss (chronic): Secondary | ICD-10-CM

## 2018-11-17 NOTE — Progress Notes (Signed)
[facility]  [address] [place], [place] [address] Phone: [phone]  Fax: [phone]   Telemedicine Office Visit:  11/17/2018  Referring physician: Benjaman Kindler, MD  I connected with Alicia Washington on 11/17/18 at 12:06 PM EDT by videoconferencing and verified that I was speaking with the correct person using 2 identifiers.  The patient was at work.  I discussed the limitations, risk, security and privacy concerns of performing an evaluation and management service by videoconferencing and the availability of in person appointments.  I also discussed with the patient that there may be a patient responsible charge related to this service.  The patient expressed understanding and agreed to proceed.    Chief Complaint: Alicia Washington is a 43 y.o. female with iron deficiency and B12 deficiency who is seen for 3 month assessment.  HPI:  The patient was last seen in the hematology clinic on 08/12/2018.  At that time, she was feeling better.  Exam was unremarkable.  Hemoglobin was 12.6.  Ferritin was 63.  She has received B12 monthly (08/18/2018 - 10/20/2018).  CBC on 09/22/2018 revealed a hematocrit of 37.8, hemoglobin 12.6 and MCV 88.1.  B12 was 380.  During the interim, she has felt tired a lot.  She denies any shortness of breath or chest pain.  She denies any bleeding.  Weight is stable.  He denies any menses.  She states that 13 years ago she had an ablation.  Diet is "the same".  She is eating a lot of meat.  She is eating less spinach.   Past Medical History:  Diagnosis Date  . Cancer ([place])    skin  . Fibromyalgia     Past Surgical History:  Procedure Laterality Date  . BREAST BIOPSY Left    core- neg  . CESAREAN SECTION    . ENDOMETRIAL ABLATION      Family History  Problem Relation Age of Onset  . Cancer Mother   . Cancer Maternal Grandfather   . Breast cancer Neg Hx     Social History:  reports that she has never smoked.  She has never used smokeless tobacco. She reports that she does not drink alcohol or use drugs.  Patient does not drink or smoke. She has 2 children (daughter 97, son 62).  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  She lives in [place].  Participants in the patient's visit and their role in the encounter included the patient and Vito Berger, CMA, today.  The intake visit was provided by Vito Berger, CMA.    Allergies: No Known Allergies  Current Medications: Current Outpatient Medications  Medication Sig Dispense Refill  . Cyanocobalamin 1000 MCG/ML KIT Inject 1,000 mcg as directed.    . sertraline (ZOLOFT) 100 MG tablet Take 100 mg by mouth daily.     Marland Kitchen omeprazole (PRILOSEC) 40 MG capsule Take 40 mg by mouth daily.  1   No current facility-administered medications for this visit.    Facility-Administered Medications Ordered in Other Visits  Medication Dose Route Frequency Provider Last Rate Last Dose  . sodium chloride flush (NS) 0.9 % injection 10 mL  10 mL Intracatheter Once PRN Karen Kitchens, NP      . sodium chloride flush (NS) 0.9 % injection 3 mL  3 mL Intracatheter Once PRN Karen Kitchens, NP        Review of Systems  Constitutional: Negative.  Negative for chills, diaphoresis, fever, malaise/fatigue  and weight loss.       Feels "tired a lot".  HENT: Negative.  Negative for congestion, ear discharge, ear pain, hearing loss, nosebleeds, sinus pain, sore throat and tinnitus.   Eyes: Negative.  Negative for blurred vision, double vision, photophobia, pain, discharge and redness.  Respiratory: Negative.  Negative for cough, hemoptysis, sputum production and shortness of breath.   Cardiovascular: Negative.  Negative for chest pain, palpitations, orthopnea, leg swelling and PND.  Gastrointestinal: Negative for abdominal pain, blood in stool, melena, nausea and vomiting.       Cyclic constipation and diarrhea.  Genitourinary: Negative.  Negative for dysuria,  frequency, hematuria and urgency.  Musculoskeletal: Negative.  Negative for back pain, falls, joint pain, myalgias and neck pain.  Skin: Negative.  Negative for itching and rash.  Neurological: Negative.  Negative for dizziness, tingling, tremors, sensory change, speech change, focal weakness, weakness and headaches.  Endo/Heme/Allergies: Negative.   Psychiatric/Behavioral: Negative.  Negative for depression and memory loss. The patient is not nervous/anxious and does not have insomnia.   All other systems reviewed and are negative.  Performance status (ECOG): 1  Vital Signs: There were no vitals taken for this visit.  Physical Exam  Constitutional: She is oriented to person, place, and time and well-developed, well-nourished, and in no distress. No distress.  HENT:  Head: Normocephalic and atraumatic.  Brown hair.  Eyes: Conjunctivae are normal. No scleral icterus.  Blue eyes.  Neurological: She is alert and oriented to person, place, and time.  Skin: She is not diaphoretic.  Psychiatric: Mood, affect and judgment normal.    Appointment on 11/16/2018  Component Date Value Ref Range Status  . Ferritin 11/16/2018 33  11 - 307 ng/mL Final   Performed at [facility], [address] [place], [place] [address]  . WBC 11/16/2018 6.2  4.0 - 10.5 K/uL Final  . RBC 11/16/2018 4.61  3.87 - 5.11 MIL/uL Final  . Hemoglobin 11/16/2018 13.7  12.0 - 15.0 g/dL Final  . HCT 11/16/2018 41.0  36.0 - 46.0 % Final  . MCV 11/16/2018 88.9  80.0 - 100.0 fL Final  . MCH 11/16/2018 29.7  26.0 - 34.0 pg Final  . MCHC 11/16/2018 33.4  30.0 - 36.0 g/dL Final  . RDW 11/16/2018 12.6  11.5 - 15.5 % Final  . Platelets 11/16/2018 239  150 - 400 K/uL Final  . nRBC 11/16/2018 0.0  0.0 - 0.2 % Final  . Neutrophils Relative % 11/16/2018 63  % Final  . Neutro Abs 11/16/2018 3.9  1.7 - 7.7 K/uL Final  . Lymphocytes Relative 11/16/2018 24  % Final  . Lymphs Abs 11/16/2018 1.5  0.7 - 4.0 K/uL Final  .  Monocytes Relative 11/16/2018 9  % Final  . Monocytes Absolute 11/16/2018 0.6  0.1 - 1.0 K/uL Final  . Eosinophils Relative 11/16/2018 2  % Final  . Eosinophils Absolute 11/16/2018 0.1  0.0 - 0.5 K/uL Final  . Basophils Relative 11/16/2018 1  % Final  . Basophils Absolute 11/16/2018 0.0  0.0 - 0.1 K/uL Final  . Immature Granulocytes 11/16/2018 1  % Final  . Abs Immature Granulocytes 11/16/2018 0.04  0.00 - 0.07 K/uL Final   Performed at [facility], [address], [place], [place] [address]    Assessment:  Alicia Washington is a 43 y.o. female with a long standing history of iron deficiency anemia.  Diet is modest.  She is unable to tolerate oral iron.  She  has chronic diarrhea with some dark stools.  Work-up on 04/30/2018 revealed a hematocrit 23.9, hemoglobin 7.0, and MCV 64.5.  Ferritin was 2.  Iron saturation was 2% with a TIBC of 499.  Reticulocyte count was 2.5%.  B12 was 184.  Normal studies included:  TSH, ANA, and folate (13.5).  Urinalysis on 05/07/2018 revealed no hematuria.    She has received IV iron in the past.  She received 1 unit of PRBCs on 05/01/2018.  She received Venofer weekly x 4 (04/30/2018 - 05/21/2018) and x 2 (06/12/2018 and 06/23/2018).  Ferritin has been followed:  4 on 08/14/2011, 133 on 10/24/2011, 125 on 12/17/2011, 18 on 10/08/2012, 3 on 04/24/2018, 2 on 04/30/2018, 52 on 06/11/2018, 63 on 08/11/2018, and 33 on 11/16/2018.  EGD on 05/11/2018 revealed erythematous mucosa in the antrum (minmal chronic gastritis and mild foveolar hyperplasia) and a 1 cm hiatal hernia.  Colonoscopy on 05/11/2018 was normal.  Random biopsies revealed no abnormalities.  Capsule enteroscopy on 06/15/2018 was normal.  She has B12 deficiency.  Intrinsic factor and anti-parietal cell antibody were normal on 06/12/2018.  She began oral B12 on 05/01/2018.  She began B12 injections on 06/12/2018 (last 10/20/2018).  B12 was 184 on 04/30/2018, 316 on 06/11/2018, and 380 on  09/22/2018. Folate was 13.5 on 04/30/2018.  Symptomatically, she feels "tired a lot".  She denies any bleeding.  Diet is good.   Hemoglobin is 13.7.  Ferritin is 33.  Plan: 1. Review labs from 11/16/2018. 2. Iron deficiency anemia  Hemoglobin 13.7.  MCV 88.9.  Ferritin 33 (down from 63 on 08/11/2018).  EGD revealed mild gastritis.  Colonoscopy and capsule study were normal.  Urinalysis was negative in 05/07/2018.  No need for IV iron now.  May need IV iron in 1-2 months. 3.   B12 deficiency  Last B12 was 10/20/2018.  Continue B12 injections monthly.  Check folate annually. 4.   RTC in 1 month for labs (CBC, ferritin, urinalysis) and B12 injection. 5.   RTC in 3 months for MD assessment, labs (CBC with diff, ferritin- day before), B12 and +/- Venofer.   I discussed the assessment and treatment plan with the patient.  The patient was provided an opportunity to ask questions and all were answered.  The patient agreed with the plan and demonstrated an understanding of the instructions.  The patient was advised to call back or seek an in person evaluation if the symptoms worsen or if the condition fails to improve as anticipated.  I provided 15 minutes (12:06 PM - 12:21 PM) of face-to-face video visit time during this this encounter and > 50% was spent counseling as documented under my assessment and plan.  I provided these services from the [facility] office.   Lequita Asal, MD, PhD  11/17/2018, 12:06 PM

## 2018-11-21 ENCOUNTER — Encounter: Payer: Self-pay | Admitting: Hematology and Oncology

## 2018-12-15 ENCOUNTER — Inpatient Hospital Stay: Payer: 59 | Attending: Hematology and Oncology

## 2018-12-15 ENCOUNTER — Other Ambulatory Visit: Payer: Self-pay

## 2018-12-15 ENCOUNTER — Inpatient Hospital Stay: Payer: 59

## 2018-12-15 VITALS — BP 109/75 | HR 85 | Temp 98.5°F | Resp 16

## 2018-12-15 DIAGNOSIS — E538 Deficiency of other specified B group vitamins: Secondary | ICD-10-CM | POA: Insufficient documentation

## 2018-12-15 DIAGNOSIS — D509 Iron deficiency anemia, unspecified: Secondary | ICD-10-CM | POA: Insufficient documentation

## 2018-12-15 DIAGNOSIS — K529 Noninfective gastroenteritis and colitis, unspecified: Secondary | ICD-10-CM | POA: Insufficient documentation

## 2018-12-15 DIAGNOSIS — D500 Iron deficiency anemia secondary to blood loss (chronic): Secondary | ICD-10-CM

## 2018-12-15 LAB — CBC WITH DIFFERENTIAL/PLATELET
Abs Immature Granulocytes: 0.06 10*3/uL (ref 0.00–0.07)
Basophils Absolute: 0 10*3/uL (ref 0.0–0.1)
Basophils Relative: 1 %
Eosinophils Absolute: 0.2 10*3/uL (ref 0.0–0.5)
Eosinophils Relative: 3 %
HCT: 38.8 % (ref 36.0–46.0)
Hemoglobin: 13.2 g/dL (ref 12.0–15.0)
Immature Granulocytes: 1 %
Lymphocytes Relative: 25 %
Lymphs Abs: 1.6 10*3/uL (ref 0.7–4.0)
MCH: 30 pg (ref 26.0–34.0)
MCHC: 34 g/dL (ref 30.0–36.0)
MCV: 88.2 fL (ref 80.0–100.0)
Monocytes Absolute: 0.5 10*3/uL (ref 0.1–1.0)
Monocytes Relative: 8 %
Neutro Abs: 4.1 10*3/uL (ref 1.7–7.7)
Neutrophils Relative %: 62 %
Platelets: 240 10*3/uL (ref 150–400)
RBC: 4.4 MIL/uL (ref 3.87–5.11)
RDW: 12.5 % (ref 11.5–15.5)
WBC: 6.5 10*3/uL (ref 4.0–10.5)
nRBC: 0 % (ref 0.0–0.2)

## 2018-12-15 LAB — URINALYSIS, COMPLETE (UACMP) WITH MICROSCOPIC
Bacteria, UA: NONE SEEN
Bilirubin Urine: NEGATIVE
Glucose, UA: NEGATIVE mg/dL
Hgb urine dipstick: NEGATIVE
Ketones, ur: NEGATIVE mg/dL
Leukocytes,Ua: NEGATIVE
Nitrite: NEGATIVE
Protein, ur: NEGATIVE mg/dL
Specific Gravity, Urine: 1.015 (ref 1.005–1.030)
pH: 5 (ref 5.0–8.0)

## 2018-12-15 LAB — FERRITIN: Ferritin: 19 ng/mL (ref 11–307)

## 2018-12-15 MED ORDER — CYANOCOBALAMIN 1000 MCG/ML IJ SOLN
1000.0000 ug | Freq: Once | INTRAMUSCULAR | Status: AC
  Administered 2018-12-15: 1000 ug via INTRAMUSCULAR

## 2018-12-16 LAB — URINE CULTURE: Culture: 20000 — AB

## 2018-12-17 ENCOUNTER — Telehealth: Payer: Self-pay

## 2018-12-17 DIAGNOSIS — D500 Iron deficiency anemia secondary to blood loss (chronic): Secondary | ICD-10-CM

## 2018-12-17 NOTE — Telephone Encounter (Signed)
Left a message on the patient voice mail to inform her that per Dr Mike Gip Ferritin has drifted down to 19. Consider Venofer weekly x 3. Need urine pregnancy test before 1st infusion secondary to age.

## 2018-12-17 NOTE — Telephone Encounter (Signed)
-----   Message from Lequita Asal, MD sent at 12/17/2018  1:43 PM EDT ----- Regarding: Please call patient  Ferritin has drifted down to 19.  Consider Venofer weekly x 3.  Need urine pregnancy test before 1st infusion secondary to age.  M ----- Message ----- From: Buel Ream, Lab [facility] [place] Sent: 12/15/2018   3:49 PM EDT To: Lequita Asal, MD

## 2018-12-21 NOTE — Telephone Encounter (Signed)
Spoke with patient regarding Ferritin levels and suggested Venofer weekly x 3. Patient agrees and request sent to Robin to schedule. Informed patient pregnancy test is needed in office with first infusion. Patient verbalizes understanding and denies any questions at this time.

## 2018-12-21 NOTE — Addendum Note (Signed)
Addended by: Waymon Budge on: 12/21/2018 02:23 PM   Modules accepted: Orders

## 2018-12-22 ENCOUNTER — Inpatient Hospital Stay: Payer: 59

## 2018-12-22 ENCOUNTER — Other Ambulatory Visit: Payer: Self-pay

## 2018-12-22 VITALS — BP 108/73 | HR 76 | Temp 97.0°F | Resp 16

## 2018-12-22 DIAGNOSIS — D500 Iron deficiency anemia secondary to blood loss (chronic): Secondary | ICD-10-CM

## 2018-12-22 DIAGNOSIS — D509 Iron deficiency anemia, unspecified: Secondary | ICD-10-CM | POA: Diagnosis not present

## 2018-12-22 LAB — PREGNANCY, URINE: Preg Test, Ur: NEGATIVE

## 2018-12-22 MED ORDER — SODIUM CHLORIDE 0.9 % IV SOLN
Freq: Once | INTRAVENOUS | Status: AC
  Administered 2018-12-22: 14:00:00 via INTRAVENOUS
  Filled 2018-12-22: qty 250

## 2018-12-22 MED ORDER — IRON SUCROSE 20 MG/ML IV SOLN
200.0000 mg | Freq: Once | INTRAVENOUS | Status: AC
  Administered 2018-12-22: 200 mg via INTRAVENOUS
  Filled 2018-12-22: qty 10

## 2018-12-22 NOTE — Patient Instructions (Signed)
Iron Sucrose injection What is this medicine? IRON SUCROSE (AHY ern SOO krohs) is an iron complex. Iron is used to make healthy red blood cells, which carry oxygen and nutrients throughout the body. This medicine is used to treat iron deficiency anemia in people with chronic kidney disease. This medicine may be used for other purposes; ask your health care provider or pharmacist if you have questions. COMMON BRAND NAME(S): Venofer What should I tell my health care provider before I take this medicine? They need to know if you have any of these conditions: -anemia not caused by low iron levels -heart disease -high levels of iron in the blood -kidney disease -liver disease -an unusual or allergic reaction to iron, other medicines, foods, dyes, or preservatives -pregnant or trying to get pregnant -breast-feeding How should I use this medicine? This medicine is for infusion into a vein. It is given by a health care professional in a hospital or clinic setting. Talk to your pediatrician regarding the use of this medicine in children. While this drug may be prescribed for children as young as 2 years for selected conditions, precautions do apply. Overdosage: If you think you have taken too much of this medicine contact a poison control center or emergency room at once. NOTE: This medicine is only for you. Do not share this medicine with others. What if I miss a dose? It is important not to miss your dose. Call your doctor or health care professional if you are unable to keep an appointment. What may interact with this medicine? Do not take this medicine with any of the following medications: -deferoxamine -dimercaprol -other iron products This medicine may also interact with the following medications: -chloramphenicol -deferasirox This list may not describe all possible interactions. Give your health care provider a list of all the medicines, herbs, non-prescription drugs, or dietary  supplements you use. Also tell them if you smoke, drink alcohol, or use illegal drugs. Some items may interact with your medicine. What should I watch for while using this medicine? Visit your doctor or healthcare professional regularly. Tell your doctor or healthcare professional if your symptoms do not start to get better or if they get worse. You may need blood work done while you are taking this medicine. You may need to follow a special diet. Talk to your doctor. Foods that contain iron include: whole grains/cereals, dried fruits, beans, or peas, leafy green vegetables, and organ meats (liver, kidney). What side effects may I notice from receiving this medicine? Side effects that you should report to your doctor or health care professional as soon as possible: -allergic reactions like skin rash, itching or hives, swelling of the face, lips, or tongue -breathing problems -changes in blood pressure -cough -fast, irregular heartbeat -feeling faint or lightheaded, falls -fever or chills -flushing, sweating, or hot feelings -joint or muscle aches/pains -seizures -swelling of the ankles or feet -unusually weak or tired Side effects that usually do not require medical attention (report to your doctor or health care professional if they continue or are bothersome): -diarrhea -feeling achy -headache -irritation at site where injected -nausea, vomiting -stomach upset -tiredness This list may not describe all possible side effects. Call your doctor for medical advice about side effects. You may report side effects to FDA at 1-800-FDA-1088. Where should I keep my medicine? This drug is given in a hospital or clinic and will not be stored at home. NOTE: This sheet is a summary. It may not cover all possible information. If   you have questions about this medicine, talk to your doctor, pharmacist, or health care provider.  2019 Elsevier/Gold Standard (2011-05-02 17:14:35)  

## 2018-12-29 ENCOUNTER — Other Ambulatory Visit: Payer: Self-pay

## 2018-12-29 ENCOUNTER — Inpatient Hospital Stay: Payer: 59

## 2018-12-29 VITALS — BP 112/70 | HR 72 | Temp 98.0°F | Resp 17

## 2018-12-29 DIAGNOSIS — D509 Iron deficiency anemia, unspecified: Secondary | ICD-10-CM | POA: Diagnosis not present

## 2018-12-29 DIAGNOSIS — D500 Iron deficiency anemia secondary to blood loss (chronic): Secondary | ICD-10-CM

## 2018-12-29 MED ORDER — IRON SUCROSE 20 MG/ML IV SOLN
200.0000 mg | Freq: Once | INTRAVENOUS | Status: AC
  Administered 2018-12-29: 200 mg via INTRAVENOUS
  Filled 2018-12-29: qty 10

## 2018-12-29 MED ORDER — SODIUM CHLORIDE 0.9 % IV SOLN
Freq: Once | INTRAVENOUS | Status: AC
  Administered 2018-12-29: 14:00:00 via INTRAVENOUS
  Filled 2018-12-29: qty 250

## 2018-12-29 NOTE — Patient Instructions (Signed)
Iron Sucrose injection What is this medicine? IRON SUCROSE (AHY ern SOO krohs) is an iron complex. Iron is used to make healthy red blood cells, which carry oxygen and nutrients throughout the body. This medicine is used to treat iron deficiency anemia in people with chronic kidney disease. This medicine may be used for other purposes; ask your health care provider or pharmacist if you have questions. COMMON BRAND NAME(S): Venofer What should I tell my health care provider before I take this medicine? They need to know if you have any of these conditions: -anemia not caused by low iron levels -heart disease -high levels of iron in the blood -kidney disease -liver disease -an unusual or allergic reaction to iron, other medicines, foods, dyes, or preservatives -pregnant or trying to get pregnant -breast-feeding How should I use this medicine? This medicine is for infusion into a vein. It is given by a health care professional in a hospital or clinic setting. Talk to your pediatrician regarding the use of this medicine in children. While this drug may be prescribed for children as young as 2 years for selected conditions, precautions do apply. Overdosage: If you think you have taken too much of this medicine contact a poison control center or emergency room at once. NOTE: This medicine is only for you. Do not share this medicine with others. What if I miss a dose? It is important not to miss your dose. Call your doctor or health care professional if you are unable to keep an appointment. What may interact with this medicine? Do not take this medicine with any of the following medications: -deferoxamine -dimercaprol -other iron products This medicine may also interact with the following medications: -chloramphenicol -deferasirox This list may not describe all possible interactions. Give your health care provider a list of all the medicines, herbs, non-prescription drugs, or dietary  supplements you use. Also tell them if you smoke, drink alcohol, or use illegal drugs. Some items may interact with your medicine. What should I watch for while using this medicine? Visit your doctor or healthcare professional regularly. Tell your doctor or healthcare professional if your symptoms do not start to get better or if they get worse. You may need blood work done while you are taking this medicine. You may need to follow a special diet. Talk to your doctor. Foods that contain iron include: whole grains/cereals, dried fruits, beans, or peas, leafy green vegetables, and organ meats (liver, kidney). What side effects may I notice from receiving this medicine? Side effects that you should report to your doctor or health care professional as soon as possible: -allergic reactions like skin rash, itching or hives, swelling of the face, lips, or tongue -breathing problems -changes in blood pressure -cough -fast, irregular heartbeat -feeling faint or lightheaded, falls -fever or chills -flushing, sweating, or hot feelings -joint or muscle aches/pains -seizures -swelling of the ankles or feet -unusually weak or tired Side effects that usually do not require medical attention (report to your doctor or health care professional if they continue or are bothersome): -diarrhea -feeling achy -headache -irritation at site where injected -nausea, vomiting -stomach upset -tiredness This list may not describe all possible side effects. Call your doctor for medical advice about side effects. You may report side effects to FDA at 1-800-FDA-1088. Where should I keep my medicine? This drug is given in a hospital or clinic and will not be stored at home. NOTE: This sheet is a summary. It may not cover all possible information. If   you have questions about this medicine, talk to your doctor, pharmacist, or health care provider.  2019 Elsevier/Gold Standard (2011-05-02 17:14:35)  

## 2019-01-05 ENCOUNTER — Inpatient Hospital Stay: Payer: 59 | Attending: Hematology and Oncology

## 2019-01-05 ENCOUNTER — Other Ambulatory Visit: Payer: Self-pay

## 2019-01-05 VITALS — BP 118/78 | HR 88 | Temp 98.0°F | Resp 18

## 2019-01-05 DIAGNOSIS — E538 Deficiency of other specified B group vitamins: Secondary | ICD-10-CM | POA: Insufficient documentation

## 2019-01-05 DIAGNOSIS — D509 Iron deficiency anemia, unspecified: Secondary | ICD-10-CM | POA: Diagnosis present

## 2019-01-05 DIAGNOSIS — D500 Iron deficiency anemia secondary to blood loss (chronic): Secondary | ICD-10-CM

## 2019-01-05 MED ORDER — IRON SUCROSE 20 MG/ML IV SOLN
200.0000 mg | Freq: Once | INTRAVENOUS | Status: AC
  Administered 2019-01-05: 200 mg via INTRAVENOUS

## 2019-01-05 MED ORDER — SODIUM CHLORIDE 0.9 % IV SOLN
Freq: Once | INTRAVENOUS | Status: AC
  Administered 2019-01-05: 14:00:00 via INTRAVENOUS
  Filled 2019-01-05: qty 250

## 2019-01-12 ENCOUNTER — Other Ambulatory Visit: Payer: Self-pay

## 2019-01-12 ENCOUNTER — Inpatient Hospital Stay: Payer: 59

## 2019-01-12 VITALS — BP 119/83 | HR 80 | Resp 18

## 2019-01-12 DIAGNOSIS — D509 Iron deficiency anemia, unspecified: Secondary | ICD-10-CM | POA: Diagnosis not present

## 2019-01-12 DIAGNOSIS — D500 Iron deficiency anemia secondary to blood loss (chronic): Secondary | ICD-10-CM

## 2019-01-12 MED ORDER — CYANOCOBALAMIN 1000 MCG/ML IJ SOLN
1000.0000 ug | Freq: Once | INTRAMUSCULAR | Status: AC
  Administered 2019-01-12: 1000 ug via INTRAMUSCULAR

## 2019-01-12 NOTE — Patient Instructions (Signed)
Cyanocobalamin, Vitamin B12 injection What is this medicine? CYANOCOBALAMIN (sye an oh koe BAL a min) is a man made form of vitamin B12. Vitamin B12 is used in the growth of healthy blood cells, nerve cells, and proteins in the body. It also helps with the metabolism of fats and carbohydrates. This medicine is used to treat people who can not absorb vitamin B12. This medicine may be used for other purposes; ask your health care provider or pharmacist if you have questions. COMMON BRAND NAME(S): B-12 Compliance Kit, B-12 Injection Kit, Cyomin, LA-12, Nutri-Twelve, Physicians EZ Use B-12, Primabalt What should I tell my health care provider before I take this medicine? They need to know if you have any of these conditions: -kidney disease -Leber's disease -megaloblastic anemia -an unusual or allergic reaction to cyanocobalamin, cobalt, other medicines, foods, dyes, or preservatives -pregnant or trying to get pregnant -breast-feeding How should I use this medicine? This medicine is injected into a muscle or deeply under the skin. It is usually given by a health care professional in a clinic or doctor's office. However, your doctor may teach you how to inject yourself. Follow all instructions. Talk to your pediatrician regarding the use of this medicine in children. Special care may be needed. Overdosage: If you think you have taken too much of this medicine contact a poison control center or emergency room at once. NOTE: This medicine is only for you. Do not share this medicine with others. What if I miss a dose? If you are given your dose at a clinic or doctor's office, call to reschedule your appointment. If you give your own injections and you miss a dose, take it as soon as you can. If it is almost time for your next dose, take only that dose. Do not take double or extra doses. What may interact with this medicine? -colchicine -heavy alcohol intake This list may not describe all possible  interactions. Give your health care provider a list of all the medicines, herbs, non-prescription drugs, or dietary supplements you use. Also tell them if you smoke, drink alcohol, or use illegal drugs. Some items may interact with your medicine. What should I watch for while using this medicine? Visit your doctor or health care professional regularly. You may need blood work done while you are taking this medicine. You may need to follow a special diet. Talk to your doctor. Limit your alcohol intake and avoid smoking to get the best benefit. What side effects may I notice from receiving this medicine? Side effects that you should report to your doctor or health care professional as soon as possible: -allergic reactions like skin rash, itching or hives, swelling of the face, lips, or tongue -blue tint to skin -chest tightness, pain -difficulty breathing, wheezing -dizziness -red, swollen painful area on the leg Side effects that usually do not require medical attention (report to your doctor or health care professional if they continue or are bothersome): -diarrhea -headache This list may not describe all possible side effects. Call your doctor for medical advice about side effects. You may report side effects to FDA at 1-800-FDA-1088. Where should I keep my medicine? Keep out of the reach of children. Store at room temperature between 15 and 30 degrees C (59 and 85 degrees F). Protect from light. Throw away any unused medicine after the expiration date. NOTE: This sheet is a summary. It may not cover all possible information. If you have questions about this medicine, talk to your doctor, pharmacist, or   health care provider.  2019 Elsevier/Gold Standard (2007-11-02 22:10:20)  

## 2019-01-18 MED ORDER — ACETAMINOPHEN NICU IV SYRINGE 10 MG/ML
INTRAVENOUS | Status: AC
  Filled 2019-01-18: qty 1

## 2019-01-18 MED ORDER — FENTANYL CITRATE (PF) 100 MCG/2ML IJ SOLN
INTRAMUSCULAR | Status: AC
  Filled 2019-01-18: qty 4

## 2019-01-18 MED ORDER — ROCURONIUM BROMIDE 50 MG/5ML IV SOLN
INTRAVENOUS | Status: AC
  Filled 2019-01-18: qty 1

## 2019-01-18 MED ORDER — DEXAMETHASONE SODIUM PHOSPHATE 4 MG/ML IJ SOLN
INTRAMUSCULAR | Status: AC
  Filled 2019-01-18: qty 1

## 2019-01-18 MED ORDER — LIDOCAINE HCL (PF) 2 % IJ SOLN
INTRAMUSCULAR | Status: AC
  Filled 2019-01-18: qty 10

## 2019-01-18 MED ORDER — PROPOFOL 10 MG/ML IV BOLUS
INTRAVENOUS | Status: AC
  Filled 2019-01-18: qty 20

## 2019-01-18 MED ORDER — ONDANSETRON HCL 4 MG/2ML IJ SOLN
INTRAMUSCULAR | Status: AC
  Filled 2019-01-18: qty 2

## 2019-01-18 MED ORDER — MIDAZOLAM HCL 2 MG/2ML IJ SOLN
INTRAMUSCULAR | Status: AC
  Filled 2019-01-18: qty 2

## 2019-02-15 NOTE — Progress Notes (Signed)
[facility]  [address] [place], [place] [address] Phone: [phone]  Fax: [phone]   Clinic Day:  02/17/2019  Referring physician: Benjaman Kindler, MD  Chief Complaint: MIHIKA SURRETTE is a 43 y.o. female  with iron deficiency and B12 deficiency who is seen for 3 month assessment.  HPI: The patient was last seen in the hematology clinic on 11/17/2018. At that time, she felt "tired a lot". She denied any bleeding. Diet was good. Hemoglobin was 13.7. Ferritin was 33.  She received B-12 on 12/15/2018.  CBC on 12/15/2018 revealed a hematocrit of 38.8, hemoglobin 13.2, and MCV 88.2. Ferritin was 19. Urinalysis negative. She received Venofer x4 (12/22/2018 - 01/12/2019).   Labs on 02/17/2019: WBC 5,200, hemoglobin 11.2, hematocrit 33.3.  Ferritin 80.   During the interim, she is "not feeling great."  Her energy level has not improved. She notes improvement in her shortness of breath following Venofer infusions. She denies any bleedings, and does not currently menstruate. She denies vomiting, but notes chronic nausea. She notes two episodes of dark green stools in the past 2 weeks.   She notes cramping and nausea, causing a decreased appetite. This has occurred for several years. Her last BM was yesteryday, and she reports it was a formed stool.   She takes ibuprofen or Tylenol daily for headaches and tense muscles. She takes Benadryl or NyQuil at night to sleep. She is not currently on oral iron, as it makes her constipated.    Past Medical History:  Diagnosis Date  . Cancer ([place])    skin  . Fibromyalgia     Past Surgical History:  Procedure Laterality Date  . BREAST BIOPSY Left    core- neg  . CESAREAN SECTION    . ENDOMETRIAL ABLATION      Family History  Problem Relation Age of Onset  . Cancer Mother   . Cancer Maternal Grandfather   . Breast cancer Neg Hx     Social History:  reports that she has never smoked. She has never  used smokeless tobacco. She reports that she does not drink alcohol or use drugs. Patient does not drink or smoke. She has 2 children (daughter 51, son 63).  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  She lives in [place].  Allergies: No Known Allergies  Current Medications: Current Outpatient Medications  Medication Sig Dispense Refill  . Cyanocobalamin 1000 MCG/ML KIT Inject 1,000 mcg as directed.    Marland Kitchen omeprazole (PRILOSEC) 40 MG capsule Take 40 mg by mouth daily.  1  . sertraline (ZOLOFT) 100 MG tablet Take 100 mg by mouth daily.      No current facility-administered medications for this visit.    Facility-Administered Medications Ordered in Other Visits  Medication Dose Route Frequency Provider Last Rate Last Dose  . sodium chloride flush (NS) 0.9 % injection 10 mL  10 mL Intracatheter Once PRN Karen Kitchens, NP      . sodium chloride flush (NS) 0.9 % injection 3 mL  3 mL Intracatheter Once PRN Karen Kitchens, NP        Review of Systems  Constitutional: Positive for malaise/fatigue. Negative for chills, diaphoresis, fever and weight loss (Up 2lbs since January).       Feels "tired a lot".  HENT: Negative.  Negative for congestion, hearing loss, sinus pain and sore throat.   Eyes: Negative.  Negative for blurred vision, double vision, photophobia, pain, discharge and redness.  Respiratory: Positive for shortness of breath (improved after IV iron). Negative for cough, hemoptysis and sputum production.   Cardiovascular: Negative.  Negative for chest pain, palpitations, orthopnea, leg swelling and PND.  Gastrointestinal: Positive for nausea (chronic). Negative for abdominal pain, blood in stool, constipation, diarrhea, melena (2 dark green stools) and vomiting.  Genitourinary: Negative.  Negative for dysuria, frequency, hematuria and urgency.  Musculoskeletal: Negative.  Negative for back pain, joint pain, myalgias and neck pain.  Skin: Negative.  Negative for rash.   Neurological: Negative.  Negative for dizziness, tingling, tremors, sensory change, speech change, focal weakness, weakness and headaches.  Endo/Heme/Allergies: Negative.   Psychiatric/Behavioral: Negative.  Negative for depression and memory loss. The patient is not nervous/anxious and does not have insomnia.   All other systems reviewed and are negative.  Performance status (ECOG): 1  Vitals Blood pressure 118/77, pulse 81, temperature (!) 97.1 F (36.2 C), temperature source Tympanic, resp. rate 16, weight 142 lb 8.4 oz (64.7 kg), SpO2 100 %.   Physical Exam  Constitutional: She is oriented to person, place, and time. She appears well-developed and well-nourished. No distress.  HENT:  Head: Normocephalic and atraumatic.  Mouth/Throat: Oropharynx is clear and moist. No oropharyngeal exudate.  Brown hair. Mask.  Eyes: Pupils are equal, round, and reactive to light. Conjunctivae and EOM are normal. No scleral icterus.  Blue eyes.   Neck: Normal range of motion. Neck supple.  Cardiovascular: Normal rate, regular rhythm and normal heart sounds.  No murmur heard. Pulmonary/Chest: Effort normal and breath sounds normal. No respiratory distress. She has no wheezes.  Abdominal: Soft. Bowel sounds are normal. She exhibits no distension. There is no abdominal tenderness.  Musculoskeletal: Normal range of motion.        General: No edema.  Lymphadenopathy:    She has no cervical adenopathy.    She has no axillary adenopathy.       Right: No supraclavicular adenopathy present.       Left: No supraclavicular adenopathy present.  Neurological: She is alert and oriented to person, place, and time.  Skin: Skin is warm and dry. She is not diaphoretic. No erythema.  Psychiatric: She has a normal mood and affect. Her behavior is normal. Judgment and thought content normal.  Nursing note and vitals reviewed.   Appointment on 02/16/2019  Component Date Value Ref Range Status  . WBC 02/16/2019  5.2  4.0 - 10.5 K/uL Final  . RBC 02/16/2019 3.61* 3.87 - 5.11 MIL/uL Final  . Hemoglobin 02/16/2019 11.2* 12.0 - 15.0 g/dL Final  . HCT 02/16/2019 33.3* 36.0 - 46.0 % Final  . MCV 02/16/2019 92.2  80.0 - 100.0 fL Final  . MCH 02/16/2019 31.0  26.0 - 34.0 pg Final  . MCHC 02/16/2019 33.6  30.0 - 36.0 g/dL Final  . RDW 02/16/2019 14.2  11.5 - 15.5 % Final  . Platelets 02/16/2019 261  150 - 400 K/uL Final  . nRBC 02/16/2019 0.0  0.0 - 0.2 % Final  . Neutrophils Relative % 02/16/2019 61  % Final  . Neutro Abs 02/16/2019 3.3  1.7 - 7.7 K/uL Final  . Lymphocytes Relative 02/16/2019 27  % Final  . Lymphs Abs 02/16/2019 1.4  0.7 - 4.0 K/uL Final  . Monocytes Relative 02/16/2019 8  % Final  . Monocytes Absolute 02/16/2019 0.4  0.1 - 1.0 K/uL Final  . Eosinophils Relative 02/16/2019 2  % Final  . Eosinophils Absolute 02/16/2019 0.1  0.0 - 0.5 K/uL Final  .  Basophils Relative 02/16/2019 1  % Final  . Basophils Absolute 02/16/2019 0.0  0.0 - 0.1 K/uL Final  . Immature Granulocytes 02/16/2019 1  % Final  . Abs Immature Granulocytes 02/16/2019 0.05  0.00 - 0.07 K/uL Final   Performed at [facility], [address], [place], [place] [address]    Assessment:  ADESSA PRIMIANO is a 43 y.o. female with a long standing history of iron deficiency anemia.  Diet is modest.  She is unable to tolerate oral iron.  She has chronic diarrhea with some dark stools.  Work-up on 04/30/2018 revealed a hematocrit 23.9, hemoglobin 7.0, and MCV 64.5.  Ferritin was 2.  Iron saturation was 2% with a TIBC of 499.  Reticulocyte count was 2.5%.  B12 was 184.  Normal studies included:  TSH, ANA, and folate (13.5).  Urinalysis on 05/07/2018 revealed no hematuria.    She has received IV iron in the past.  She received 1 unit of PRBCs on 05/01/2018.  She received Venofer weekly x 4 (04/30/2018 - 05/21/2018), x 2 (06/12/2018 and 06/23/2018), and x4 (12/22/2018 - 01/12/2019).   Ferritin has been followed:  4 on  08/14/2011, 133 on 10/24/2011, 125 on 12/17/2011, 18 on 10/08/2012, 3 on 04/24/2018, 2 on 04/30/2018, 52 on 06/11/2018, 63 on 08/11/2018, 33 on 11/16/2018, 19 on 12/15/2018, and 80 on 02/16/2019.  EGD on 05/11/2018 revealed erythematous mucosa in the antrum (minmal chronic gastritis and mild foveolar hyperplasia) and a 1 cm hiatal hernia.  Colonoscopy on 05/11/2018 was normal.  Random biopsies revealed no abnormalities.  Capsule enteroscopy on 06/15/2018 was normal.  She has B12 deficiency.  Intrinsic factor and anti-parietal cell antibody were normal on 06/12/2018.  She began oral B12 on 05/01/2018.  She began B12 injections on 06/12/2018 (last 10/20/2018).  B12 was 184 on 04/30/2018, 316 on 06/11/2018, and 380 on 09/22/2018. Folate was 13.5 on 04/30/2018.  Symptomatically, she remains fatigued.  She denies any bleeding.  She has had a couple of dark green stools.  Plan: 1.   Review labs from 02/16/2019. 2.   Iron deficiency anemia             Hemoglobin 11.2.  MCV 92.2.             Ferritin 80 (not back at time of clinic appointment).   Contact patient with results of today's ferritin.             Prior EGD revealed mild gastritis.             Colonoscopy and capsule study were normal.  Unclear significance of dark green stools.   Guaiac cards x3.             Urinalysis was negative in 05/2018.             No IV iron today.  Discuss additional iron if ferritin < 30.  If ferritin normal, check retic count, iron studies, folate, TSH and haptoglobin. 3.   B12 deficiency             B12 today and monthly x 6. 4.   RTC in 6 weeks for labs (CBC with diff, ferritin). 5.   RTC in 3 months for MD assessment, labs (CBC with diff, ferritin-day before), B12, and +/- Venofer.  I discussed the assessment and treatment plan with the patient.  The patient was provided an opportunity to ask questions and all were answered.  The patient agreed with the plan and demonstrated an  understanding of the  instructions.  The patient was advised to call back if the symptoms worsen or if the condition fails to improve as anticipated.  I provided 15 minutes of face-to-face time during this this encounter and > 50% was spent counseling as documented under my assessment and plan.    Lequita Asal, MD, PhD    02/17/2019, 10:50 AM  I, Molly Dorshimer, am acting as Education administrator for [employer]. Mike Gip, MD, PhD.  I,  C. Mike Gip, MD, have reviewed the above documentation for accuracy and completeness, and I agree with the above.

## 2019-02-16 ENCOUNTER — Inpatient Hospital Stay: Payer: 59 | Attending: Hematology and Oncology

## 2019-02-16 ENCOUNTER — Other Ambulatory Visit: Payer: Self-pay

## 2019-02-16 DIAGNOSIS — D500 Iron deficiency anemia secondary to blood loss (chronic): Secondary | ICD-10-CM

## 2019-02-16 DIAGNOSIS — R110 Nausea: Secondary | ICD-10-CM | POA: Insufficient documentation

## 2019-02-16 DIAGNOSIS — D509 Iron deficiency anemia, unspecified: Secondary | ICD-10-CM | POA: Diagnosis present

## 2019-02-16 DIAGNOSIS — E538 Deficiency of other specified B group vitamins: Secondary | ICD-10-CM | POA: Diagnosis not present

## 2019-02-16 LAB — CBC WITH DIFFERENTIAL/PLATELET
Abs Immature Granulocytes: 0.05 10*3/uL (ref 0.00–0.07)
Basophils Absolute: 0 10*3/uL (ref 0.0–0.1)
Basophils Relative: 1 %
Eosinophils Absolute: 0.1 10*3/uL (ref 0.0–0.5)
Eosinophils Relative: 2 %
HCT: 33.3 % — ABNORMAL LOW (ref 36.0–46.0)
Hemoglobin: 11.2 g/dL — ABNORMAL LOW (ref 12.0–15.0)
Immature Granulocytes: 1 %
Lymphocytes Relative: 27 %
Lymphs Abs: 1.4 10*3/uL (ref 0.7–4.0)
MCH: 31 pg (ref 26.0–34.0)
MCHC: 33.6 g/dL (ref 30.0–36.0)
MCV: 92.2 fL (ref 80.0–100.0)
Monocytes Absolute: 0.4 10*3/uL (ref 0.1–1.0)
Monocytes Relative: 8 %
Neutro Abs: 3.3 10*3/uL (ref 1.7–7.7)
Neutrophils Relative %: 61 %
Platelets: 261 10*3/uL (ref 150–400)
RBC: 3.61 MIL/uL — ABNORMAL LOW (ref 3.87–5.11)
RDW: 14.2 % (ref 11.5–15.5)
WBC: 5.2 10*3/uL (ref 4.0–10.5)
nRBC: 0 % (ref 0.0–0.2)

## 2019-02-17 ENCOUNTER — Inpatient Hospital Stay: Payer: 59 | Attending: Hematology and Oncology | Admitting: Hematology and Oncology

## 2019-02-17 ENCOUNTER — Inpatient Hospital Stay: Payer: 59

## 2019-02-17 ENCOUNTER — Encounter: Payer: Self-pay | Admitting: Hematology and Oncology

## 2019-02-17 VITALS — BP 118/77 | HR 81 | Temp 97.1°F | Resp 16 | Wt 142.5 lb

## 2019-02-17 DIAGNOSIS — E538 Deficiency of other specified B group vitamins: Secondary | ICD-10-CM

## 2019-02-17 DIAGNOSIS — D500 Iron deficiency anemia secondary to blood loss (chronic): Secondary | ICD-10-CM

## 2019-02-17 DIAGNOSIS — R110 Nausea: Secondary | ICD-10-CM | POA: Diagnosis not present

## 2019-02-17 DIAGNOSIS — D509 Iron deficiency anemia, unspecified: Secondary | ICD-10-CM

## 2019-02-17 LAB — IRON AND TIBC
Iron: 51 ug/dL (ref 28–170)
Saturation Ratios: 16 % (ref 10.4–31.8)
TIBC: 323 ug/dL (ref 250–450)
UIBC: 271 ug/dL

## 2019-02-17 LAB — RETICULOCYTES
Immature Retic Fract: 14.9 % (ref 2.3–15.9)
RBC.: 3.64 MIL/uL — ABNORMAL LOW (ref 3.87–5.11)
Retic Count, Absolute: 104.5 10*3/uL (ref 19.0–186.0)
Retic Ct Pct: 2.9 % (ref 0.4–3.1)

## 2019-02-17 LAB — FOLATE: Folate: 8.2 ng/mL (ref >5.9)

## 2019-02-17 LAB — TSH: TSH: 0.906 u[IU]/mL (ref 0.350–4.500)

## 2019-02-17 LAB — FERRITIN: Ferritin: 80 ng/mL (ref 11–307)

## 2019-02-17 MED ORDER — CYANOCOBALAMIN 1000 MCG/ML IJ SOLN
1000.0000 ug | Freq: Once | INTRAMUSCULAR | Status: AC
  Administered 2019-02-17: 1000 ug via INTRAMUSCULAR

## 2019-02-17 NOTE — Patient Instructions (Signed)

## 2019-02-17 NOTE — Progress Notes (Signed)
Pt here for follow up. Reports decrease in appetite x couple of weeks. Also reports she has noticed two "very dark green" stools over the past two weeks with last episode being over the weekend. Denies any acute abdominal pain.

## 2019-02-18 LAB — HAPTOGLOBIN: Haptoglobin: 142 mg/dL (ref 42–296)

## 2019-02-19 ENCOUNTER — Other Ambulatory Visit: Payer: 59

## 2019-02-19 ENCOUNTER — Ambulatory Visit: Payer: 59

## 2019-02-19 ENCOUNTER — Ambulatory Visit: Payer: 59 | Admitting: Hematology and Oncology

## 2019-03-02 DIAGNOSIS — D509 Iron deficiency anemia, unspecified: Secondary | ICD-10-CM | POA: Diagnosis not present

## 2019-03-03 DIAGNOSIS — D509 Iron deficiency anemia, unspecified: Secondary | ICD-10-CM | POA: Diagnosis not present

## 2019-03-04 ENCOUNTER — Other Ambulatory Visit: Payer: Self-pay

## 2019-03-04 DIAGNOSIS — D509 Iron deficiency anemia, unspecified: Secondary | ICD-10-CM | POA: Diagnosis not present

## 2019-03-04 LAB — OCCULT BLOOD X 1 CARD TO LAB, STOOL
Fecal Occult Bld: POSITIVE — AB
Fecal Occult Bld: NEGATIVE
Fecal Occult Bld: NEGATIVE

## 2019-03-17 ENCOUNTER — Inpatient Hospital Stay: Payer: 59 | Attending: Hematology and Oncology

## 2019-03-17 ENCOUNTER — Other Ambulatory Visit: Payer: Self-pay

## 2019-03-17 ENCOUNTER — Telehealth: Payer: Self-pay

## 2019-03-17 VITALS — BP 124/83 | HR 80 | Temp 97.9°F | Resp 18

## 2019-03-17 DIAGNOSIS — D509 Iron deficiency anemia, unspecified: Secondary | ICD-10-CM | POA: Diagnosis not present

## 2019-03-17 DIAGNOSIS — D500 Iron deficiency anemia secondary to blood loss (chronic): Secondary | ICD-10-CM

## 2019-03-17 DIAGNOSIS — E538 Deficiency of other specified B group vitamins: Secondary | ICD-10-CM | POA: Diagnosis not present

## 2019-03-17 MED ORDER — CYANOCOBALAMIN 1000 MCG/ML IJ SOLN
1000.0000 ug | Freq: Once | INTRAMUSCULAR | Status: AC
  Administered 2019-03-17: 15:00:00 1000 ug via INTRAMUSCULAR

## 2019-03-17 NOTE — Patient Instructions (Signed)

## 2019-03-17 NOTE — Telephone Encounter (Signed)
Spoke with Ms Butch Penny at Dr Lavonia Drafts office to inform them that the patient came in for her B-12 injection and reports 1 out of 3 of her  Guaiac cards was positive. I have informed them them that Dr Mike Gip was the provider who has given the patient the Guaiac cards. Ms Butch Penny does report the patient has not been seen since 11 2019. But Ms Butch Penny does report that they will reach out to the patient as soon as possible.

## 2019-03-31 ENCOUNTER — Other Ambulatory Visit: Payer: Self-pay

## 2019-03-31 ENCOUNTER — Inpatient Hospital Stay: Payer: 59

## 2019-03-31 DIAGNOSIS — E538 Deficiency of other specified B group vitamins: Secondary | ICD-10-CM | POA: Diagnosis not present

## 2019-03-31 DIAGNOSIS — D509 Iron deficiency anemia, unspecified: Secondary | ICD-10-CM

## 2019-03-31 LAB — CBC WITH DIFFERENTIAL/PLATELET
Abs Immature Granulocytes: 0.04 10*3/uL (ref 0.00–0.07)
Basophils Absolute: 0 10*3/uL (ref 0.0–0.1)
Basophils Relative: 0 %
Eosinophils Absolute: 0.1 10*3/uL (ref 0.0–0.5)
Eosinophils Relative: 2 %
HCT: 36.6 % (ref 36.0–46.0)
Hemoglobin: 12.3 g/dL (ref 12.0–15.0)
Immature Granulocytes: 1 %
Lymphocytes Relative: 28 %
Lymphs Abs: 1.7 10*3/uL (ref 0.7–4.0)
MCH: 29.9 pg (ref 26.0–34.0)
MCHC: 33.6 g/dL (ref 30.0–36.0)
MCV: 88.8 fL (ref 80.0–100.0)
Monocytes Absolute: 0.5 10*3/uL (ref 0.1–1.0)
Monocytes Relative: 8 %
Neutro Abs: 3.7 10*3/uL (ref 1.7–7.7)
Neutrophils Relative %: 61 %
Platelets: 237 10*3/uL (ref 150–400)
RBC: 4.12 MIL/uL (ref 3.87–5.11)
RDW: 12.2 % (ref 11.5–15.5)
WBC: 6 10*3/uL (ref 4.0–10.5)
nRBC: 0 % (ref 0.0–0.2)

## 2019-03-31 LAB — FERRITIN: Ferritin: 46 ng/mL (ref 11–307)

## 2019-04-06 ENCOUNTER — Telehealth: Payer: Self-pay

## 2019-04-06 NOTE — Telephone Encounter (Signed)
-----   Message from Secundino Ginger sent at 04/06/2019  3:24 PM EDT ----- Regarding: lab results Alicia Washington called and would like her lab results from last Wed

## 2019-04-06 NOTE — Telephone Encounter (Signed)
Patient called back and was inquiring about her ferritin levels which it has decrease to 46 from last labs. The patient was understanding

## 2019-04-06 NOTE — Telephone Encounter (Signed)
Left a message to infrom the patient about her lab results. I was able to release her lab results into the patient mychart. I have informed the patient if she have any question please feel free to contact the office at her earliest time.

## 2019-04-06 NOTE — Telephone Encounter (Signed)
-----   Message from Secundino Ginger sent at 04/06/2019  3:24 PM EDT ----- Regarding: lab results [place] called and would like her lab results from last Wed

## 2019-04-14 ENCOUNTER — Other Ambulatory Visit: Payer: Self-pay

## 2019-04-14 ENCOUNTER — Inpatient Hospital Stay: Payer: 59 | Attending: Hematology and Oncology

## 2019-04-14 VITALS — BP 108/72 | HR 74 | Temp 97.5°F | Resp 18

## 2019-04-14 DIAGNOSIS — E538 Deficiency of other specified B group vitamins: Secondary | ICD-10-CM | POA: Insufficient documentation

## 2019-04-14 DIAGNOSIS — D500 Iron deficiency anemia secondary to blood loss (chronic): Secondary | ICD-10-CM

## 2019-04-14 MED ORDER — CYANOCOBALAMIN 1000 MCG/ML IJ SOLN
1000.0000 ug | Freq: Once | INTRAMUSCULAR | Status: AC
  Administered 2019-04-14: 1000 ug via INTRAMUSCULAR

## 2019-05-11 ENCOUNTER — Inpatient Hospital Stay: Payer: 59 | Attending: Hematology and Oncology

## 2019-05-11 ENCOUNTER — Other Ambulatory Visit: Payer: Self-pay

## 2019-05-11 DIAGNOSIS — E538 Deficiency of other specified B group vitamins: Secondary | ICD-10-CM | POA: Insufficient documentation

## 2019-05-11 DIAGNOSIS — D509 Iron deficiency anemia, unspecified: Secondary | ICD-10-CM | POA: Diagnosis not present

## 2019-05-11 LAB — CBC WITH DIFFERENTIAL/PLATELET
Abs Immature Granulocytes: 0.04 10*3/uL (ref 0.00–0.07)
Basophils Absolute: 0 10*3/uL (ref 0.0–0.1)
Basophils Relative: 0 %
Eosinophils Absolute: 0.1 10*3/uL (ref 0.0–0.5)
Eosinophils Relative: 2 %
HCT: 40.1 % (ref 36.0–46.0)
Hemoglobin: 13.2 g/dL (ref 12.0–15.0)
Immature Granulocytes: 1 %
Lymphocytes Relative: 27 %
Lymphs Abs: 1.4 10*3/uL (ref 0.7–4.0)
MCH: 29.1 pg (ref 26.0–34.0)
MCHC: 32.9 g/dL (ref 30.0–36.0)
MCV: 88.3 fL (ref 80.0–100.0)
Monocytes Absolute: 0.3 10*3/uL (ref 0.1–1.0)
Monocytes Relative: 5 %
Neutro Abs: 3.4 10*3/uL (ref 1.7–7.7)
Neutrophils Relative %: 65 %
Platelets: 245 10*3/uL (ref 150–400)
RBC: 4.54 MIL/uL (ref 3.87–5.11)
RDW: 12 % (ref 11.5–15.5)
WBC: 5.2 10*3/uL (ref 4.0–10.5)
nRBC: 0 % (ref 0.0–0.2)

## 2019-05-11 LAB — FERRITIN: Ferritin: 33 ng/mL (ref 11–307)

## 2019-05-11 NOTE — Progress Notes (Signed)
[facility]  [address] [place], [place] [address] Phone: [phone]  Fax: [phone]   Clinic Day:  05/12/2019  Referring physician: Benjaman Kindler, MD  Chief Complaint: Alicia Washington is a 43 y.o. female with iron deficiency and B12 deficiency who is seen for 3 month assessment   HPI: The patient was last seen in the hematology clinic on 02/17/2019. At that time, she remained fatigued.  She denied any bleeding.  She had a some dark green stools. 07 14//2020 Hematocrit 33.3, hemoglobin 11.2, MCV 92.2, platelets 261,000, WBC 5200, ANC 3300. On 02/17/2019 Iron 51, TIBC 323 Sat 16%. Folate 8.2. Haptoglobin 142. TSH 0.906  She received B12 monthly (02/17/2019, 03/17/2019, and 04/14/2019).  CBC has been followed: 03/31/2019: Hematocrit 36.6, hemoglobin 12.3 and MCV 88.8.  Ferritin 46. 05/11/2019: Hematocrit 40.1, hemoglobin 13.2 and MCV 88.3.  Ferritin 33.  Guaiac cards x 3 were + x 1 (03/03/2019 - 03/04/2019).  During the interim, she remains fatigued. She had a single positive guaiac, with no active episodes of rectal bleeding.  One year ago, she saw Dr. Alice Reichert and was told that she had some internal hemorrhoids.  She had no issues at that time.    Symptomatically, she is doing well but is still fatigued. She is feels better that her iron is getting better.   Past Medical History:  Diagnosis Date  . Cancer ([place])    skin  . Fibromyalgia     Past Surgical History:  Procedure Laterality Date  . BREAST BIOPSY Left    core- neg  . CESAREAN SECTION    . ENDOMETRIAL ABLATION      Family History  Problem Relation Age of Onset  . Cancer Mother   . Cancer Maternal Grandfather   . Breast cancer Neg Hx     Social History:  reports that she has never smoked. She has never used smokeless tobacco. She reports that she does not drink alcohol or use drugs. She has 2 children (daughter 27, son 92). 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 She lives in [place]. 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 If one child gets COVID every one has to go into quarantine and stay home. 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  Allergies: No Known Allergies  Current Medications: Current Outpatient Medications  Medication Sig Dispense Refill  . Cyanocobalamin 1000 MCG/ML KIT Inject 1,000 mcg as directed.    Marland Kitchen omeprazole (PRILOSEC) 40 MG capsule Take 40 mg by mouth daily.  1  . sertraline (ZOLOFT) 100 MG tablet Take 100 mg by mouth daily.      No current facility-administered medications for this visit.    Facility-Administered Medications Ordered in Other Visits  Medication Dose Route Frequency Provider Last Rate Last Dose  . sodium chloride flush (NS) 0.9 % injection 10 mL  10 mL Intracatheter Once PRN Karen Kitchens, NP      . sodium chloride flush (NS) 0.9 % injection 3 mL  3 mL Intracatheter Once PRN Karen Kitchens, NP        Review of Systems  Constitutional: Positive for malaise/fatigue (persists). Negative for chills, diaphoresis, fever and weight loss (up 2lbs since 08/2018).       Feels "the same".  HENT: Negative.  Negative for congestion, hearing loss, nosebleeds, sinus pain and sore throat.   Eyes: Negative.  Negative for blurred vision, double vision, photophobia, pain, discharge and redness.  Respiratory: Negative.  Negative  for cough, hemoptysis, sputum production and shortness of breath.   Cardiovascular: Negative.  Negative for chest pain, palpitations, orthopnea, leg swelling and PND.  Gastrointestinal: Positive for blood in stool (single episode 03/03/2019). Negative for abdominal pain, constipation, diarrhea, melena, nausea (chronic) and vomiting.  Genitourinary: Negative.  Negative for dysuria, frequency, hematuria and urgency.  Musculoskeletal: Negative.  Negative for back pain, joint pain, myalgias and neck pain.  Skin: Negative.  Negative for rash.  Neurological: Negative.  Negative for dizziness, tingling,  tremors, sensory change, speech change, focal weakness, weakness and headaches.  Endo/Heme/Allergies: Negative.   Psychiatric/Behavioral: Negative.  Negative for depression and memory loss. The patient is not nervous/anxious and does not have insomnia.   All other systems reviewed and are negative.  Performance status (ECOG):  1  Vitals Blood pressure 123/73, pulse 77, temperature (!) 96.9 F (36.1 C), temperature source Tympanic, resp. rate 18, height '5\' 2"'$  (1.575 m), weight 144 lb 10 oz (65.6 kg), SpO2 100 %.   Physical Exam  Constitutional: She is oriented to person, place, and time. She appears well-developed and well-nourished. No distress.  HENT:  Head: Normocephalic and atraumatic.  Mouth/Throat: Oropharynx is clear and moist. No oropharyngeal exudate.  Long brown hair. Mask.  Eyes: Pupils are equal, round, and reactive to light. Conjunctivae and EOM are normal. No scleral icterus.  Blue eyes.   Neck: Normal range of motion. Neck supple. No JVD present.  Cardiovascular: Normal rate, regular rhythm and normal heart sounds.  Pulmonary/Chest: Effort normal and breath sounds normal. No respiratory distress. She has no wheezes.  Abdominal: Soft. Bowel sounds are normal. She exhibits no distension and no mass. There is no abdominal tenderness. There is no rebound and no guarding.  Musculoskeletal: Normal range of motion.        General: No edema.  Lymphadenopathy:    She has no cervical adenopathy.    She has no axillary adenopathy.       Right: No supraclavicular adenopathy present.       Left: No supraclavicular adenopathy present.  Neurological: She is alert and oriented to person, place, and time.  Skin: Skin is warm. No rash noted. She is not diaphoretic. No erythema. No pallor.  Psychiatric: She has a normal mood and affect. Her behavior is normal. Judgment and thought content normal.  Nursing note and vitals reviewed.   Appointment on 05/11/2019  Component Date Value Ref  Range Status  . Ferritin 05/11/2019 33  11 - 307 ng/mL Final   Performed at [facility], [address] [place], [place] [address]  . WBC 05/11/2019 5.2  4.0 - 10.5 K/uL Final  . RBC 05/11/2019 4.54  3.87 - 5.11 MIL/uL Final  . Hemoglobin 05/11/2019 13.2  12.0 - 15.0 g/dL Final  . HCT 05/11/2019 40.1  36.0 - 46.0 % Final  . MCV 05/11/2019 88.3  80.0 - 100.0 fL Final  . MCH 05/11/2019 29.1  26.0 - 34.0 pg Final  . MCHC 05/11/2019 32.9  30.0 - 36.0 g/dL Final  . RDW 05/11/2019 12.0  11.5 - 15.5 % Final  . Platelets 05/11/2019 245  150 - 400 K/uL Final  . nRBC 05/11/2019 0.0  0.0 - 0.2 % Final  . Neutrophils Relative % 05/11/2019 65  % Final  . Neutro Abs 05/11/2019 3.4  1.7 - 7.7 K/uL Final  . Lymphocytes Relative 05/11/2019 27  % Final  . Lymphs Abs 05/11/2019 1.4  0.7 - 4.0 K/uL Final  . Monocytes Relative 05/11/2019  5  % Final  . Monocytes Absolute 05/11/2019 0.3  0.1 - 1.0 K/uL Final  . Eosinophils Relative 05/11/2019 2  % Final  . Eosinophils Absolute 05/11/2019 0.1  0.0 - 0.5 K/uL Final  . Basophils Relative 05/11/2019 0  % Final  . Basophils Absolute 05/11/2019 0.0  0.0 - 0.1 K/uL Final  . Immature Granulocytes 05/11/2019 1  % Final  . Abs Immature Granulocytes 05/11/2019 0.04  0.00 - 0.07 K/uL Final   Performed at [facility], [address], [place], [place] [address]    Assessment:  Alicia Washington is a 43 y.o. female a long standing history ofiron deficiency anemia. Dietis modest. She is unable to tolerate oral iron. She has chronic diarrhea with some dark stools.  Work-up on 04/30/2018 revealed a hematocrit 23.9, hemoglobin 7.0, and MCV 64.5. Ferritin was 2. Iron saturation was 2% with a TIBC of 499. Reticulocyte count was 2.5%. B12 was 184. Normal studies included: TSH, ANA, and folate (13.5). Urinalysis on 05/07/2018 revealed no hematuria.   She has received IV ironin the past. She received 1 unit of PRBCson 05/01/2018. She  received Venoferweekly x 4 (04/30/2018 - 05/21/2018), x 2 (06/12/2018 and 06/23/2018), and x4 (12/22/2018 - 01/12/2019).   Ferritinhas been followed: 4 on 08/14/2011, 133 on 10/24/2011, 125 on 12/17/2011, 18 on 10/08/2012, 3 on 04/24/2018, 2 on 04/30/2018, 52 on 06/11/2018, 63 on 08/11/2018, 33 on 11/16/2018, 19 on 12/15/2018, and 80 on 02/16/2019.  EGD on 05/11/2018 revealed erythematous mucosa in the antrum (minmal chronic gastritis and mild foveolar hyperplasia) and a 1 cm hiatal hernia. Colonoscopyon 05/11/2018 was normal. Random biopsies revealed no abnormalities.Capsule enteroscopyon 06/15/2018 was normal.  She has B12 deficiency. Intrinsic factor and anti-parietal cell antibody were normal on 06/12/2018. She began oral B12 on 05/01/2018. She began B12 injections on 06/12/2018 (last 04/14/2019).B12 was 184 on 04/30/2018,316 on 06/11/2018, and 380 on 09/22/2018.Folate was 13.5 on 04/30/2018 and 8.2 on 02/17/2019.  Symptomatically, she remains fatigued.  Exam is unremarkable.  Hemoglobin is 13.2.  Plan: 1.   Review labs from 05/11/2019. 2.   Iron deficiency anemia Hematocrit 40.1.  Hemoglobin 13.2.  MCV 88.3. Ferritin 33. Prior EGD revealed mild gastritis. Colonoscopy and capsule study were normal.             Guaiac card x 1 + (unclear significance with hemorrhoids). Urinalysis was negative in 05/2018. Discuss plan for additional IV iron if ferritin < 30.             No IV iron today.  Continue to monitor. 3. B12 deficiency Continue B12 monthly (last 04/14/2019).  B12 today and monthly x 6.  Check folate annually. 4.   Fatigue  Appears unrelated to hemoglobin.  TSH was normal on 02/17/2019. 5.   RTC in 8 weeks for labs (CBC with diff, ferritin). 6.   RTC in 4 months for MD assessment, labs (CBC with diff, ferritin- day before), B12, and +/- Venofer.  I discussed the assessment  and treatment plan with the patient.  The patient was provided an opportunity to ask questions and all were answered.  The patient agreed with the plan and demonstrated an understanding of the instructions.  The patient was advised to call back if the symptoms worsen or if the condition fails to improve as anticipated.   Lequita Asal, MD, PhD    05/12/2019, 2:05 PM  I, Samul Dada, am acting as a scribe for Lequita Asal, MD.  I, [place]  Mike Gip, MD, have reviewed the above documentation for accuracy and completeness, and I agree with the above.

## 2019-05-12 ENCOUNTER — Inpatient Hospital Stay (HOSPITAL_BASED_OUTPATIENT_CLINIC_OR_DEPARTMENT_OTHER): Payer: 59 | Admitting: Hematology and Oncology

## 2019-05-12 ENCOUNTER — Encounter: Payer: Self-pay | Admitting: Hematology and Oncology

## 2019-05-12 ENCOUNTER — Inpatient Hospital Stay: Payer: 59

## 2019-05-12 VITALS — BP 123/73 | HR 77 | Temp 96.9°F | Resp 18 | Ht 62.0 in | Wt 144.6 lb

## 2019-05-12 DIAGNOSIS — D509 Iron deficiency anemia, unspecified: Secondary | ICD-10-CM | POA: Diagnosis not present

## 2019-05-12 DIAGNOSIS — E538 Deficiency of other specified B group vitamins: Secondary | ICD-10-CM | POA: Diagnosis not present

## 2019-05-12 DIAGNOSIS — D500 Iron deficiency anemia secondary to blood loss (chronic): Secondary | ICD-10-CM

## 2019-05-12 MED ORDER — CYANOCOBALAMIN 1000 MCG/ML IJ SOLN
1000.0000 ug | Freq: Once | INTRAMUSCULAR | Status: AC
  Administered 2019-05-12: 1000 ug via INTRAMUSCULAR

## 2019-05-12 NOTE — Patient Instructions (Signed)

## 2019-05-12 NOTE — Progress Notes (Signed)
No new changes noted today 

## 2019-06-09 ENCOUNTER — Inpatient Hospital Stay: Payer: 59 | Attending: Hematology and Oncology

## 2019-07-07 ENCOUNTER — Inpatient Hospital Stay: Payer: 59 | Attending: Hematology and Oncology

## 2019-07-07 ENCOUNTER — Other Ambulatory Visit: Payer: Self-pay

## 2019-07-07 ENCOUNTER — Inpatient Hospital Stay: Payer: 59

## 2019-07-07 VITALS — BP 113/80 | HR 73 | Temp 97.0°F | Resp 18

## 2019-07-07 DIAGNOSIS — E538 Deficiency of other specified B group vitamins: Secondary | ICD-10-CM | POA: Diagnosis present

## 2019-07-07 DIAGNOSIS — D509 Iron deficiency anemia, unspecified: Secondary | ICD-10-CM | POA: Insufficient documentation

## 2019-07-07 LAB — CBC WITH DIFFERENTIAL/PLATELET
Abs Immature Granulocytes: 0.03 10*3/uL (ref 0.00–0.07)
Basophils Absolute: 0 10*3/uL (ref 0.0–0.1)
Basophils Relative: 1 %
Eosinophils Absolute: 0.1 10*3/uL (ref 0.0–0.5)
Eosinophils Relative: 1 %
HCT: 36.3 % (ref 36.0–46.0)
Hemoglobin: 11.9 g/dL — ABNORMAL LOW (ref 12.0–15.0)
Immature Granulocytes: 1 %
Lymphocytes Relative: 28 %
Lymphs Abs: 1.7 10*3/uL (ref 0.7–4.0)
MCH: 28.8 pg (ref 26.0–34.0)
MCHC: 32.8 g/dL (ref 30.0–36.0)
MCV: 87.9 fL (ref 80.0–100.0)
Monocytes Absolute: 0.5 10*3/uL (ref 0.1–1.0)
Monocytes Relative: 8 %
Neutro Abs: 3.8 10*3/uL (ref 1.7–7.7)
Neutrophils Relative %: 61 %
Platelets: 256 10*3/uL (ref 150–400)
RBC: 4.13 MIL/uL (ref 3.87–5.11)
RDW: 13.2 % (ref 11.5–15.5)
WBC: 6.1 10*3/uL (ref 4.0–10.5)
nRBC: 0 % (ref 0.0–0.2)

## 2019-07-07 LAB — FERRITIN: Ferritin: 21 ng/mL (ref 11–307)

## 2019-07-07 MED ORDER — CYANOCOBALAMIN 1000 MCG/ML IJ SOLN
1000.0000 ug | Freq: Once | INTRAMUSCULAR | Status: AC
  Administered 2019-07-07: 1000 ug via INTRAMUSCULAR

## 2019-07-07 NOTE — Patient Instructions (Signed)

## 2019-07-12 ENCOUNTER — Telehealth: Payer: Self-pay

## 2019-07-12 NOTE — Telephone Encounter (Signed)
-----   Message from Secundino Ginger sent at 07/12/2019  3:52 PM EST ----- Regarding: lab results Contact: [phone] This patient called to get her lab results. Please call

## 2019-07-12 NOTE — Telephone Encounter (Signed)
I tired to reach out to the patient she called wanting her lab results. No answer i was able to leave a message. I have also informed the patient to contact the office.

## 2019-07-15 ENCOUNTER — Inpatient Hospital Stay: Payer: 59

## 2019-07-15 ENCOUNTER — Other Ambulatory Visit: Payer: Self-pay

## 2019-07-15 VITALS — BP 118/79 | HR 71 | Temp 98.4°F | Resp 16

## 2019-07-15 DIAGNOSIS — D509 Iron deficiency anemia, unspecified: Secondary | ICD-10-CM | POA: Diagnosis not present

## 2019-07-15 DIAGNOSIS — D500 Iron deficiency anemia secondary to blood loss (chronic): Secondary | ICD-10-CM

## 2019-07-15 MED ORDER — IRON SUCROSE 20 MG/ML IV SOLN
200.0000 mg | Freq: Once | INTRAVENOUS | Status: AC
  Administered 2019-07-15: 200 mg via INTRAVENOUS
  Filled 2019-07-15: qty 10

## 2019-07-15 MED ORDER — SODIUM CHLORIDE 0.9 % IV SOLN
Freq: Once | INTRAVENOUS | Status: AC
  Administered 2019-07-15: 15:00:00 via INTRAVENOUS
  Filled 2019-07-15: qty 250

## 2019-07-15 MED ORDER — SODIUM CHLORIDE 0.9% FLUSH
10.0000 mL | Freq: Once | INTRAVENOUS | Status: DC | PRN
  Filled 2019-07-15: qty 10

## 2019-07-15 NOTE — Patient Instructions (Signed)

## 2019-08-04 ENCOUNTER — Inpatient Hospital Stay: Payer: 59

## 2019-08-10 ENCOUNTER — Other Ambulatory Visit: Payer: Self-pay

## 2019-08-10 ENCOUNTER — Inpatient Hospital Stay: Payer: 59 | Attending: Hematology and Oncology

## 2019-08-10 VITALS — BP 128/85 | HR 81 | Temp 97.3°F | Resp 18

## 2019-08-10 DIAGNOSIS — E538 Deficiency of other specified B group vitamins: Secondary | ICD-10-CM | POA: Insufficient documentation

## 2019-08-10 DIAGNOSIS — D500 Iron deficiency anemia secondary to blood loss (chronic): Secondary | ICD-10-CM

## 2019-08-10 MED ORDER — CYANOCOBALAMIN 1000 MCG/ML IJ SOLN
1000.0000 ug | Freq: Once | INTRAMUSCULAR | Status: AC
  Administered 2019-08-10: 1000 ug via INTRAMUSCULAR

## 2019-08-10 NOTE — Patient Instructions (Signed)

## 2019-09-10 ENCOUNTER — Other Ambulatory Visit: Payer: Self-pay

## 2019-09-10 ENCOUNTER — Inpatient Hospital Stay: Payer: 59 | Attending: Hematology and Oncology

## 2019-09-10 DIAGNOSIS — E538 Deficiency of other specified B group vitamins: Secondary | ICD-10-CM | POA: Diagnosis not present

## 2019-09-10 DIAGNOSIS — D509 Iron deficiency anemia, unspecified: Secondary | ICD-10-CM | POA: Diagnosis not present

## 2019-09-10 LAB — CBC WITH DIFFERENTIAL/PLATELET
Abs Immature Granulocytes: 0.04 10*3/uL (ref 0.00–0.07)
Basophils Absolute: 0 10*3/uL (ref 0.0–0.1)
Basophils Relative: 0 %
Eosinophils Absolute: 0.1 10*3/uL (ref 0.0–0.5)
Eosinophils Relative: 1 %
HCT: 36.8 % (ref 36.0–46.0)
Hemoglobin: 12 g/dL (ref 12.0–15.0)
Immature Granulocytes: 1 %
Lymphocytes Relative: 26 %
Lymphs Abs: 1.7 10*3/uL (ref 0.7–4.0)
MCH: 28.8 pg (ref 26.0–34.0)
MCHC: 32.6 g/dL (ref 30.0–36.0)
MCV: 88.5 fL (ref 80.0–100.0)
Monocytes Absolute: 0.5 10*3/uL (ref 0.1–1.0)
Monocytes Relative: 8 %
Neutro Abs: 4.2 10*3/uL (ref 1.7–7.7)
Neutrophils Relative %: 64 %
Platelets: 251 10*3/uL (ref 150–400)
RBC: 4.16 MIL/uL (ref 3.87–5.11)
RDW: 12.5 % (ref 11.5–15.5)
WBC: 6.5 10*3/uL (ref 4.0–10.5)
nRBC: 0 % (ref 0.0–0.2)

## 2019-09-11 LAB — FERRITIN: Ferritin: 13 ng/mL (ref 11–307)

## 2019-09-12 NOTE — Progress Notes (Signed)
[facility]  [address] [place], [place] [address] Phone: [phone]  Fax: [phone]   Clinic Day:  09/15/2019  Referring physician: Benjaman Kindler, MD  Chief Complaint: Alicia Washington is a 44 y.o. female with iron deficiency and B12 deficiency  who is seen for 4 month assessment.    HPI: The patient was last seen in the hematology clinic on 05/12/2019. At that time, she remained fatigued.  Exam was unremarkable. Hemoglobin was 13.2. She received B12 injection.   She received monthly B12 on 07/07/2019 and 08/10/2019.  She received Venofer on 07/15/2019.  CBC followed 07/07/2019:  Hematocrit 36.3, hemoglobin 11.9, MCV 87.9, platelets 256,000, WBC 6100, ANC 3800.  Ferritin 21. 09/10/2019:  Hematocrit 36.8, hemoglobin 12.0, MCV 88.5, platelets 251,000, WBC 6500, ANC 4200.  Ferritin 13.  During the interim, she notes palpitations and shortness of breath with exertion over the past month.  She denies any bleeding.  She has some constipation and nausea.  She has had some neck and shoulder blade pain.  She denies any fever flulike symptoms.  She notes some chills at night.  Symptoms have been present for the past month.   Past Medical History:  Diagnosis Date  . Cancer ([place])    skin  . Fibromyalgia     Past Surgical History:  Procedure Laterality Date  . BREAST BIOPSY Left    core- neg  . CESAREAN SECTION    . ENDOMETRIAL ABLATION      Family History  Problem Relation Age of Onset  . Cancer Mother   . Cancer Maternal Grandfather   . Breast cancer Neg Hx     Social History:  reports that she has never smoked. She has never used smokeless tobacco. She reports that she does not drink alcohol or use drugs. She has 2 children (daughter 43, son 62). 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 If one child gets COVID every one has to go into quarantine and stay home  The patient is alone today.  Allergies: No Known Allergies  Current Medications: Current Outpatient Medications  Medication Sig Dispense Refill  . sertraline (ZOLOFT) 100 MG tablet Take 100 mg by mouth daily.     . Cyanocobalamin 1000 MCG/ML KIT Inject 1,000 mcg as directed.    Marland Kitchen omeprazole (PRILOSEC) 40 MG capsule Take 40 mg by mouth daily.  1   No current facility-administered medications for this visit.   Facility-Administered Medications Ordered in Other Visits  Medication Dose Route Frequency Provider Last Rate Last Admin  . sodium chloride flush (NS) 0.9 % injection 10 mL  10 mL Intracatheter Once PRN Karen Kitchens, NP      . sodium chloride flush (NS) 0.9 % injection 3 mL  3 mL Intracatheter Once PRN Karen Kitchens, NP        Review of Systems  Constitutional: Positive for malaise/fatigue (persists). Negative for chills, diaphoresis, fever and weight loss (up 1 lb).  HENT: Negative.  Negative for congestion, ear pain, hearing loss, nosebleeds, sinus pain and sore throat.   Eyes: Negative.  Negative for blurred vision, double vision and photophobia.  Respiratory: Positive for shortness of breath (with exertion x 1 month). Negative for cough, hemoptysis and sputum production.   Cardiovascular: Positive for palpitations. Negative for chest pain, orthopnea, leg swelling and PND.  Gastrointestinal: Positive for constipation and nausea (chronic). Negative for abdominal  pain, blood in stool, diarrhea, melena and vomiting.  Genitourinary: Negative.  Negative for dysuria, frequency, hematuria and urgency.  Musculoskeletal: Positive for neck pain (x 1 week). Negative for back pain, joint pain and myalgias.       Scapula pain.  Skin: Negative.  Negative for rash.  Neurological: Negative.  Negative for dizziness, tremors, sensory change, speech change, focal weakness, weakness and headaches.  Endo/Heme/Allergies: Negative.   Psychiatric/Behavioral: Negative.  Negative for depression and  memory loss. The patient is not nervous/anxious and does not have insomnia.   All other systems reviewed and are negative.  Performance status (ECOG):  1  Vitals Blood pressure 122/78, pulse 80, temperature 97.9 F (36.6 C), temperature source Tympanic, resp. rate 18, height '5\' 2"'  (1.575 m), weight 145 lb 15.1 oz (66.2 kg), SpO2 100 %.   Physical Exam  Constitutional: She is oriented to person, place, and time. She appears well-developed. No distress.  HENT:  Head: Normocephalic and atraumatic.  Mouth/Throat: Oropharynx is clear and moist. No oropharyngeal exudate.  Short brown hair.  Mask.  Eyes: Pupils are equal, round, and reactive to light. Conjunctivae and EOM are normal. No scleral icterus.  Blue eyes.   Neck: No JVD present.  Cardiovascular: Normal rate, regular rhythm and normal heart sounds.  Pulmonary/Chest: Effort normal and breath sounds normal. No respiratory distress. She has no wheezes. She has no rales.  Abdominal: Soft. Bowel sounds are normal. She exhibits no distension and no mass. There is no abdominal tenderness. There is no rebound and no guarding.  Musculoskeletal:        General: No edema. Normal range of motion.     Cervical back: Normal range of motion and neck supple.  Lymphadenopathy:       Head (right side): No preauricular and no posterior auricular adenopathy present.       Head (left side): No preauricular, no posterior auricular and no occipital adenopathy present.    She has no cervical adenopathy.    She has no axillary adenopathy.       Right: No inguinal and no supraclavicular adenopathy present.       Left: No inguinal and no supraclavicular adenopathy present.  Neurological: She is alert and oriented to person, place, and time.  Skin: Skin is warm and dry. No rash noted. She is not diaphoretic. No erythema. No pallor.  Psychiatric: She has a normal mood and affect. Her behavior is normal. Judgment and thought content normal.  Nursing note and  vitals reviewed.   No visits with results within 3 Day(s) from this visit.  Latest known visit with results is:  Appointment on 09/10/2019  Component Date Value Ref Range Status  . WBC 09/10/2019 6.5  4.0 - 10.5 K/uL Final  . RBC 09/10/2019 4.16  3.87 - 5.11 MIL/uL Final  . Hemoglobin 09/10/2019 12.0  12.0 - 15.0 g/dL Final  . HCT 09/10/2019 36.8  36.0 - 46.0 % Final  . MCV 09/10/2019 88.5  80.0 - 100.0 fL Final  . MCH 09/10/2019 28.8  26.0 - 34.0 pg Final  . MCHC 09/10/2019 32.6  30.0 - 36.0 g/dL Final  . RDW 09/10/2019 12.5  11.5 - 15.5 % Final  . Platelets 09/10/2019 251  150 - 400 K/uL Final  . nRBC 09/10/2019 0.0  0.0 - 0.2 % Final  . Neutrophils Relative % 09/10/2019 64  % Final  . Neutro Abs 09/10/2019 4.2  1.7 - 7.7 K/uL Final  . Lymphocytes Relative 09/10/2019 26  %  Final  . Lymphs Abs 09/10/2019 1.7  0.7 - 4.0 K/uL Final  . Monocytes Relative 09/10/2019 8  % Final  . Monocytes Absolute 09/10/2019 0.5  0.1 - 1.0 K/uL Final  . Eosinophils Relative 09/10/2019 1  % Final  . Eosinophils Absolute 09/10/2019 0.1  0.0 - 0.5 K/uL Final  . Basophils Relative 09/10/2019 0  % Final  . Basophils Absolute 09/10/2019 0.0  0.0 - 0.1 K/uL Final  . Immature Granulocytes 09/10/2019 1  % Final  . Abs Immature Granulocytes 09/10/2019 0.04  0.00 - 0.07 K/uL Final   Performed at [facility], [address] [place], [place] [address]  . Ferritin 09/10/2019 13  11 - 307 ng/mL Final   Performed at [facility], [address] [place], [place] [address]    Assessment:  Alicia Washington is a 44 y.o. female with a long standing history ofiron deficiency anemia. Dietis modest. She is unable to tolerate oral iron. She has chronic diarrhea with some dark stools.  Work-up on 04/30/2018 revealed a hematocrit 23.9, hemoglobin 7.0, and MCV 64.5. Ferritin was 2. Iron saturation was 2% with a TIBC of 499. Reticulocyte count was 2.5%. B12 was 184. Normal studies included:  TSH, ANA, and folate (13.5). Urinalysis on 05/07/2018 revealed no hematuria.   She has received IV ironin the past. She received 1 unit of PRBCson 05/01/2018. She received Venoferweekly x 4 (04/30/2018 - 05/21/2018),x 2 (06/12/2018 and 06/23/2018), and x4 (12/22/2018 - 01/12/2019).  Ferritinhas been followed: 4 on 08/14/2011, 133 on 10/24/2011, 125 on 12/17/2011, 18 on 10/08/2012, 3 on 04/24/2018, 2 on 04/30/2018, 52 on 06/11/2018, 63 on 08/11/2018, 33 on 11/16/2018, 19 on 12/15/2018, 80 on 02/16/2019, 46 on 03/31/2019, 33 on 05/21/2019, 21 on 07/07/2019, and 13 on 09/10/2019.  EGD on 05/11/2018 revealed erythematous mucosa in the antrum (minmal chronic gastritis and mild foveolar hyperplasia) and a 1 cm hiatal hernia. Colonoscopyon 05/11/2018 was normal. Random biopsies revealed no abnormalities.Capsule enteroscopyon 06/15/2018 was normal.  She has B12 deficiency. Intrinsic factor and anti-parietal cell antibody were normal on 06/12/2018. She began oral B12 on 05/01/2018. She began B12 injections on 06/12/2018 (last 08/10/2019).B12 was 184 on 04/30/2018,316 on 06/11/2018, and 380 on 09/22/2018.Folate was 13.5 on 04/30/2018 and 8.2 on 02/17/2019.  Symptomatically, she notes palpitations and shortness of breath x 1 month.  She denies any bleeding.  Plan: 1.   Review labs from 09/10/2019. 2.Iron deficiency anemia Hematocrit 36.8.  Hemoglobin12.0. MCV 88.5. Ferritin13. PriorEGD revealed mild gastritis. Colonoscopy and capsule study were normal. Guaiac card x 1 + (unclear significance with hemorrhoids). Urinalysiswas negative on 12/15/2018. Venofer today and weekly x 2 (total 3).   Urine pregnancy test. 3. B12 deficiency Continue B12 monthly (last 08/10/2019).             B12 today and monthly x 6.             Check folate annually. 4.   Fatigue              Appears unrelated to hemoglobin.             TSH was normal on 02/17/2019.  Recheck TSH and free T4 today. 5.   Palpitations and shortness of breath  Encourage patient to follow-up with Dr Leafy Ro as etiology not secondary to iron deficiency. 6.   RTC in 8 weeks for labs (CBC with diff, ferritin) and B12  7.   RTC in 4 months for MD assessment, labs (CBC with diff,  ferritin- day before) and +/- Venofer.  I discussed the assessment and treatment plan with the patient.  The patient was provided an opportunity to ask questions and all were answered.  The patient agreed with the plan and demonstrated an understanding of the instructions.  The patient was advised to call back if the symptoms worsen or if the condition fails to improve as anticipated.   Lequita Asal, MD, PhD    09/15/2019, 1:58 PM

## 2019-09-13 ENCOUNTER — Other Ambulatory Visit: Payer: 59

## 2019-09-15 ENCOUNTER — Encounter: Payer: Self-pay | Admitting: Hematology and Oncology

## 2019-09-15 ENCOUNTER — Inpatient Hospital Stay: Payer: 59

## 2019-09-15 ENCOUNTER — Inpatient Hospital Stay (HOSPITAL_BASED_OUTPATIENT_CLINIC_OR_DEPARTMENT_OTHER): Payer: 59 | Admitting: Hematology and Oncology

## 2019-09-15 ENCOUNTER — Other Ambulatory Visit: Payer: Self-pay

## 2019-09-15 VITALS — BP 122/78 | HR 80 | Temp 97.9°F | Resp 18 | Ht 62.0 in | Wt 145.9 lb

## 2019-09-15 DIAGNOSIS — E538 Deficiency of other specified B group vitamins: Secondary | ICD-10-CM

## 2019-09-15 DIAGNOSIS — D509 Iron deficiency anemia, unspecified: Secondary | ICD-10-CM

## 2019-09-15 DIAGNOSIS — R5383 Other fatigue: Secondary | ICD-10-CM

## 2019-09-15 DIAGNOSIS — D500 Iron deficiency anemia secondary to blood loss (chronic): Secondary | ICD-10-CM

## 2019-09-15 LAB — TSH: TSH: 0.567 u[IU]/mL (ref 0.350–4.500)

## 2019-09-15 LAB — T4, FREE: Free T4: 0.68 ng/dL (ref 0.61–1.12)

## 2019-09-15 LAB — PREGNANCY, URINE: Preg Test, Ur: NEGATIVE

## 2019-09-15 MED ORDER — CYANOCOBALAMIN 1000 MCG/ML IJ SOLN
INTRAMUSCULAR | Status: AC
  Filled 2019-09-15: qty 1

## 2019-09-15 MED ORDER — IRON SUCROSE 20 MG/ML IV SOLN
200.0000 mg | Freq: Once | INTRAVENOUS | Status: AC
  Administered 2019-09-15: 15:00:00 200 mg via INTRAVENOUS

## 2019-09-15 MED ORDER — CYANOCOBALAMIN 1000 MCG/ML IJ SOLN
1000.0000 ug | Freq: Once | INTRAMUSCULAR | Status: AC
  Administered 2019-09-15: 15:00:00 1000 ug via INTRAMUSCULAR

## 2019-09-15 MED ORDER — SODIUM CHLORIDE 0.9 % IV SOLN
Freq: Once | INTRAVENOUS | Status: AC
  Filled 2019-09-15: qty 250

## 2019-09-15 NOTE — Progress Notes (Signed)
The patient c/o palpation with chest tighten of her chest  And increase SOB with any type of movement. The patient also reports pain noted to upper back by right shoulder blade of her back. Pain level 6 today she started having symptoms 1 month ago.

## 2019-09-15 NOTE — Patient Instructions (Signed)

## 2019-09-22 ENCOUNTER — Other Ambulatory Visit: Payer: Self-pay

## 2019-09-22 ENCOUNTER — Inpatient Hospital Stay: Payer: 59

## 2019-09-22 VITALS — BP 115/76 | HR 74 | Temp 97.7°F | Resp 18

## 2019-09-22 DIAGNOSIS — D509 Iron deficiency anemia, unspecified: Secondary | ICD-10-CM | POA: Diagnosis not present

## 2019-09-22 DIAGNOSIS — D500 Iron deficiency anemia secondary to blood loss (chronic): Secondary | ICD-10-CM

## 2019-09-22 MED ORDER — SODIUM CHLORIDE 0.9 % IV SOLN
Freq: Once | INTRAVENOUS | Status: AC
  Filled 2019-09-22: qty 250

## 2019-09-22 MED ORDER — IRON SUCROSE 20 MG/ML IV SOLN
200.0000 mg | Freq: Once | INTRAVENOUS | Status: AC
  Administered 2019-09-22: 200 mg via INTRAVENOUS

## 2019-09-22 NOTE — Patient Instructions (Signed)

## 2019-09-29 ENCOUNTER — Inpatient Hospital Stay: Payer: 59

## 2019-09-29 ENCOUNTER — Other Ambulatory Visit: Payer: Self-pay

## 2019-09-30 ENCOUNTER — Inpatient Hospital Stay: Payer: 59

## 2019-09-30 VITALS — BP 112/75 | HR 84 | Temp 97.7°F | Resp 18

## 2019-09-30 DIAGNOSIS — D509 Iron deficiency anemia, unspecified: Secondary | ICD-10-CM | POA: Diagnosis not present

## 2019-09-30 DIAGNOSIS — D500 Iron deficiency anemia secondary to blood loss (chronic): Secondary | ICD-10-CM

## 2019-09-30 MED ORDER — IRON SUCROSE 20 MG/ML IV SOLN
200.0000 mg | Freq: Once | INTRAVENOUS | Status: AC
  Administered 2019-09-30: 200 mg via INTRAVENOUS

## 2019-09-30 MED ORDER — SODIUM CHLORIDE 0.9 % IV SOLN
Freq: Once | INTRAVENOUS | Status: AC
  Filled 2019-09-30: qty 250

## 2019-09-30 MED ORDER — SODIUM CHLORIDE 0.9% FLUSH
10.0000 mL | Freq: Once | INTRAVENOUS | Status: DC | PRN
  Filled 2019-09-30: qty 10

## 2019-09-30 MED ORDER — SODIUM CHLORIDE 0.9% FLUSH
3.0000 mL | Freq: Once | INTRAVENOUS | Status: DC | PRN
  Filled 2019-09-30: qty 3

## 2019-09-30 NOTE — Patient Instructions (Signed)

## 2019-10-13 ENCOUNTER — Other Ambulatory Visit: Payer: Self-pay

## 2019-10-13 ENCOUNTER — Inpatient Hospital Stay: Payer: 59 | Attending: Nurse Practitioner

## 2019-10-13 VITALS — BP 115/78 | HR 79 | Temp 97.2°F

## 2019-10-13 DIAGNOSIS — E538 Deficiency of other specified B group vitamins: Secondary | ICD-10-CM | POA: Diagnosis present

## 2019-10-13 DIAGNOSIS — D500 Iron deficiency anemia secondary to blood loss (chronic): Secondary | ICD-10-CM

## 2019-10-13 MED ORDER — CYANOCOBALAMIN 1000 MCG/ML IJ SOLN
1000.0000 ug | Freq: Once | INTRAMUSCULAR | Status: AC
  Administered 2019-10-13: 1000 ug via INTRAMUSCULAR

## 2019-11-09 ENCOUNTER — Other Ambulatory Visit: Payer: Self-pay

## 2019-11-09 ENCOUNTER — Ambulatory Visit
Admission: EM | Admit: 2019-11-09 | Discharge: 2019-11-09 | Disposition: A | Discharge status: Home / Self Care | Payer: 59 | Attending: Family Medicine | Admitting: Family Medicine

## 2019-11-09 DIAGNOSIS — L255 Unspecified contact dermatitis due to plants, except food: Secondary | ICD-10-CM

## 2019-11-09 HISTORY — DX: Iron deficiency: E61.1

## 2019-11-09 MED ORDER — PREDNISONE 20 MG PO TABS: ORAL_TABLET | ORAL | 0 refills | Status: DC

## 2019-11-09 NOTE — Discharge Instructions (Signed)
Over the counter cortisone ointment, benadryl, claritin/zyrtec

## 2019-11-09 NOTE — ED Provider Notes (Signed)
[facility]    CSN: [id number] Arrival date & time: 11/09/19  1316      History   Chief Complaint Chief Complaint  Patient presents with  . Rash    HPI Alicia Washington is a 44 y.o. female.   44 yo female with a c/o rash on her arms after doing yardwork last weekend. States rash is itchy and now also has it on her left foot. States she was clearing some vines put not sure which type of plant. Has tried over the counter medications without relief. Denies any swelling, wheezing, shortness of breath, fevers, chills.    Rash   Past Medical History:  Diagnosis Date  . Cancer ([place])    skin  . Fibromyalgia   . Low iron     Patient Active Problem List   Diagnosis Date Noted  . B12 deficiency 05/02/2018  . Iron deficiency anemia 04/30/2018    Past Surgical History:  Procedure Laterality Date  . BREAST BIOPSY Left    core- neg  . CESAREAN SECTION    . ENDOMETRIAL ABLATION      OB History   No obstetric history on file.      Home Medications    Prior to Admission medications   Medication Sig Start Date End Date Taking? Authorizing Provider  Cyanocobalamin 1000 MCG/ML KIT Inject 1,000 mcg as directed.   Yes [provider]  IRON SUCROSE IV Inject into the vein every 30 (thirty) days.   Yes [provider]  sertraline (ZOLOFT) 100 MG tablet Take 100 mg by mouth daily.    Yes [provider]  omeprazole (PRILOSEC) 40 MG capsule Take 40 mg by mouth daily. 04/20/18   [provider]  predniSONE (DELTASONE) 20 MG tablet 3 tabs po qd x 2 days, then 2 tabs po qd x 3 days, then 1 tab po qd x 3 days, then half a tab po qd x 2 days 11/09/19   Norval Gable, MD    Family History Family History  Problem Relation Age of Onset  . Cancer Mother   . Cancer Maternal Grandfather   . Breast cancer Neg Hx     Social History Social History   Tobacco Use  . Smoking status: Never Smoker  . Smokeless tobacco: Never Used  Substance  Use Topics  . Alcohol use: No  . Drug use: Never     Allergies   Patient has no known allergies.   Review of Systems Review of Systems  Skin: Positive for rash.     Physical Exam Triage Vital Signs ED Triage Vitals  Enc Vitals Group     BP 11/09/19 1353 123/89     Pulse Rate 11/09/19 1353 77     Resp 11/09/19 1353 18     Temp 11/09/19 1353 98.3 F (36.8 C)     Temp Source 11/09/19 1353 Oral     SpO2 11/09/19 1353 97 %     Weight --      Height --      Head Circumference --      Peak Flow --      Pain Score 11/09/19 1344 2     Pain Loc --      Pain Edu? --      Excl. in [place]? --    No data found.  Updated Vital Signs BP 123/89 (BP Location: Right Arm)   Pulse 77   Temp 98.3 F (36.8 C) (Oral)   Resp  18   SpO2 97%   Visual Acuity Right Eye Distance:   Left Eye Distance:   Bilateral Distance:    Right Eye Near:   Left Eye Near:    Bilateral Near:     Physical Exam Vitals and nursing note reviewed.  Constitutional:      General: She is not in acute distress.    Appearance: She is not toxic-appearing or diaphoretic.  Skin:    Findings: Rash present.     Comments: Papulovesicular rash on arms  Neurological:     Mental Status: She is alert.      UC Treatments / Results  Labs (all labs ordered are listed, but only abnormal results are displayed) Labs Reviewed - No data to display  EKG   Radiology No results found.  Procedures Procedures (including critical care time)  Medications Ordered in UC Medications - No data to display  Initial Impression / Assessment and Plan / UC Course  I have reviewed the triage vital signs and the nursing notes.  Pertinent labs & imaging results that were available during my care of the patient were reviewed by me and considered in my medical decision making (see chart for details).      Final Clinical Impressions(s) / UC Diagnoses   Final diagnoses:  Contact dermatitis due to plant     Discharge  Instructions     Over the counter cortisone ointment, benadryl, claritin/zyrtec    ED Prescriptions    Medication Sig Dispense Auth. Provider   predniSONE (DELTASONE) 20 MG tablet 3 tabs po qd x 2 days, then 2 tabs po qd x 3 days, then 1 tab po qd x 3 days, then half a tab po qd x 2 days 16 tablet Conty, [place], MD      1. diagnosis reviewed with patient 2. rx as per orders above; reviewed possible side effects, interactions, risks and benefits  3. Recommend supportive treatment with otc cortisone ointment and otc antihistamines prn 4. Follow-up prn if symptoms worsen or don't improve   PDMP not reviewed this encounter.   Norval Gable, MD 11/09/19 [phone]

## 2019-11-09 NOTE — ED Triage Notes (Signed)
Pt reports rash after completing yardwork over the weekend.  Start on forearms bilaterally and now also L foot.  Has tried Zanfel, Benadryl and Ivarest ointments with no relief.  Reports rash as itchy and painful.

## 2019-11-10 ENCOUNTER — Inpatient Hospital Stay: Payer: 59 | Attending: Hematology and Oncology

## 2019-11-10 ENCOUNTER — Inpatient Hospital Stay: Payer: 59

## 2019-11-10 ENCOUNTER — Telehealth: Payer: Self-pay

## 2019-11-10 VITALS — BP 125/79 | HR 80 | Temp 97.0°F | Resp 18

## 2019-11-10 DIAGNOSIS — R5383 Other fatigue: Secondary | ICD-10-CM

## 2019-11-10 DIAGNOSIS — E538 Deficiency of other specified B group vitamins: Secondary | ICD-10-CM | POA: Diagnosis not present

## 2019-11-10 DIAGNOSIS — D500 Iron deficiency anemia secondary to blood loss (chronic): Secondary | ICD-10-CM

## 2019-11-10 DIAGNOSIS — D509 Iron deficiency anemia, unspecified: Secondary | ICD-10-CM

## 2019-11-10 LAB — CBC WITH DIFFERENTIAL/PLATELET
Abs Immature Granulocytes: 0.13 K/uL — ABNORMAL HIGH (ref 0.00–0.07)
Basophils Absolute: 0 K/uL (ref 0.0–0.1)
Basophils Relative: 0 %
Eosinophils Absolute: 0 K/uL (ref 0.0–0.5)
Eosinophils Relative: 0 %
HCT: 36.8 % (ref 36.0–46.0)
Hemoglobin: 12.2 g/dL (ref 12.0–15.0)
Immature Granulocytes: 1 %
Lymphocytes Relative: 9 %
Lymphs Abs: 1.4 K/uL (ref 0.7–4.0)
MCH: 29.1 pg (ref 26.0–34.0)
MCHC: 33.2 g/dL (ref 30.0–36.0)
MCV: 87.8 fL (ref 80.0–100.0)
Monocytes Absolute: 0.7 K/uL (ref 0.1–1.0)
Monocytes Relative: 5 %
Neutro Abs: 13.3 K/uL — ABNORMAL HIGH (ref 1.7–7.7)
Neutrophils Relative %: 85 %
Platelets: 284 K/uL (ref 150–400)
RBC: 4.19 MIL/uL (ref 3.87–5.11)
RDW: 13.2 % (ref 11.5–15.5)
WBC: 15.5 K/uL — ABNORMAL HIGH (ref 4.0–10.5)
nRBC: 0 % (ref 0.0–0.2)

## 2019-11-10 LAB — FERRITIN: Ferritin: 99 ng/mL (ref 11–307)

## 2019-11-10 MED ORDER — CYANOCOBALAMIN 1000 MCG/ML IJ SOLN
1000.0000 ug | Freq: Once | INTRAMUSCULAR | Status: AC
  Administered 2019-11-10: 1000 ug via INTRAMUSCULAR
  Filled 2019-11-10: qty 1

## 2019-11-10 NOTE — Telephone Encounter (Signed)
This patient is here for lab/inj. She is on Prednisone and wanted [place] to know in case her labs are off

## 2019-12-08 ENCOUNTER — Inpatient Hospital Stay: Payer: 59

## 2019-12-09 ENCOUNTER — Other Ambulatory Visit: Payer: Self-pay

## 2019-12-09 ENCOUNTER — Inpatient Hospital Stay: Payer: 59 | Attending: Hematology and Oncology

## 2019-12-09 VITALS — BP 125/79 | HR 83 | Temp 96.8°F | Resp 18

## 2019-12-09 DIAGNOSIS — E538 Deficiency of other specified B group vitamins: Secondary | ICD-10-CM | POA: Insufficient documentation

## 2019-12-09 DIAGNOSIS — D500 Iron deficiency anemia secondary to blood loss (chronic): Secondary | ICD-10-CM

## 2019-12-09 MED ORDER — CYANOCOBALAMIN 1000 MCG/ML IJ SOLN
1000.0000 ug | Freq: Once | INTRAMUSCULAR | Status: AC
  Administered 2019-12-09: 1000 ug via INTRAMUSCULAR

## 2019-12-09 NOTE — Patient Instructions (Signed)

## 2019-12-29 NOTE — Progress Notes (Signed)
[facility]  [address], [address] [place], [place] [address] Phone: [phone]  Fax: [phone]   Clinic Day:  01/05/2020  Referring physician: Benjaman Kindler, MD  Chief Complaint: Alicia Washington is a 44 y.o. female with iron deficiency and B12 deficiency  who is seen for 4 month assessment.    HPI: The patient was last seen in the hematology clinic on 09/15/2019. At that time, she noted palpitations and shortness of breath x 1 month.  She denied any bleeding. TSH was 0.567 and free T4 was 0.68.   She received Venofer weekly x 3 (09/15/2019, 09/22/2019, and 09/30/2019). She received B-12 monthly (10/13/2019 - 12/09/2019).   Patient presented to ER on 11/09/2019 for rash on her arms and left foot secondary to doing yard work. She tried over the counter medications without relief. She denied swelling, wheezing, shortness of breath, fever, and chills. Over the counter cortisone and antihistamines prn were recommended.   Labs followed: 11/10/2019:  Hematocrit of 36.8, hemoglobin 12.2, MCV 87.8, platelets 284,000, WBC 15500, ANC 10258. Ferritin 99.  01/04/2020:  Hematocrit of 37.7, hemoglobin 12.4, MCV 88.9, platelets 274,000, WBC 6500, ANC 4200. Ferritin 57.   Symptomatically, she is doing 'good' today. She says when she received Venofer, it was the best she has felt in years. She felt a lot more energized. She feels like she has to have a ferritin level of 100 to feel her best. When her ferritin levels drop, she begins to feel extremely fatigued and not energized. She no longer has periods. She receives B-12 shots monthly. Her last injection was on May 6th 2021.    Past Medical History:  Diagnosis Date  . Cancer ([place])    skin  . Fibromyalgia   . Low iron     Past Surgical History:  Procedure Laterality Date  . BREAST BIOPSY Left    core- neg  . CESAREAN SECTION    . ENDOMETRIAL ABLATION      Family History  Problem Relation Age of Onset  .  Cancer Mother   . Cancer Maternal Grandfather   . Breast cancer Neg Hx     Social History:  reports that she has never smoked. She has never used smokeless tobacco. She reports that she does not drink alcohol or use drugs. She has 2 children (daughter 38, son 77). 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 She lives in [place]. 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 If one child gets COVID every one has to go into quarantine and stay home. The patient is alone  today.  Allergies: No Known Allergies  Current Medications: Current Outpatient Medications  Medication Sig Dispense Refill  . Cyanocobalamin 1000 MCG/ML KIT Inject 1,000 mcg as directed.    . fluticasone (FLONASE) 50 MCG/ACT nasal spray Place into the nose.    . IRON SUCROSE IV Inject into the vein every 30 (thirty) days.    Marland Kitchen loratadine (CLARITIN) 10 MG tablet Take by mouth.    . sertraline (ZOLOFT) 100 MG tablet Take 100 mg by mouth daily.     Marland Kitchen omeprazole (PRILOSEC) 40 MG capsule Take 40 mg by mouth daily.  1  . predniSONE (DELTASONE) 20 MG tablet 3 tabs po qd x 2 days, then 2 tabs po qd x 3 days, then 1 tab po qd x 3 days, then half a tab po qd x 2 days (Patient not taking: Reported on 01/05/2020) 16 tablet 0  No current facility-administered medications for this visit.   Facility-Administered Medications Ordered in Other Visits  Medication Dose Route Frequency Provider Last Rate Last Admin  . sodium chloride flush (NS) 0.9 % injection 10 mL  10 mL Intracatheter Once PRN Karen Kitchens, NP      . sodium chloride flush (NS) 0.9 % injection 3 mL  3 mL Intracatheter Once PRN Karen Kitchens, NP        Review of Systems  Constitutional: Positive for malaise/fatigue (persists). Negative for chills, diaphoresis, fever and weight loss.       Feels "good".   HENT: Negative.  Negative for congestion, ear pain, hearing loss, nosebleeds, sinus pain and sore throat.   Eyes: Negative.  Negative for blurred vision, double  vision and photophobia.  Respiratory: Positive for shortness of breath (with exertion x 1 month). Negative for cough, hemoptysis and sputum production.   Cardiovascular: Positive for palpitations (intermittent; improved). Negative for chest pain, orthopnea, leg swelling and PND.  Gastrointestinal: Negative for abdominal pain, blood in stool, constipation, diarrhea, heartburn, melena, nausea and vomiting.  Genitourinary: Negative.  Negative for dysuria, frequency, hematuria and urgency.  Musculoskeletal: Negative for back pain, joint pain, myalgias and neck pain.  Skin: Negative.  Negative for rash.  Neurological: Negative.  Negative for dizziness, tremors, sensory change, speech change, focal weakness, weakness and headaches.  Endo/Heme/Allergies: Negative.   Psychiatric/Behavioral: Negative.  Negative for depression and memory loss. The patient is not nervous/anxious and does not have insomnia.   All other systems reviewed and are negative.  Performance status (ECOG): 1 - Symptomatic but completely ambulatory  Vitals Blood pressure 117/77, pulse 81, resp. rate 18, weight 154 lb 5.2 oz (70 kg), SpO2 99 %.   Physical Exam Vitals and nursing note reviewed.  Constitutional:      General: She is not in acute distress.    Appearance: She is well-developed and well-nourished. She is not diaphoretic.  HENT:     Head: Normocephalic and atraumatic.     Mouth/Throat:     Mouth: Oropharynx is clear and moist.     Pharynx: No oropharyngeal exudate.      Comments: Short brown hair.  Mask. Eyes:     General: No scleral icterus.       Right eye: No discharge.        Left eye: No discharge.     Extraocular Movements: EOM normal.     Conjunctiva/sclera: Conjunctivae normal.     Pupils: Pupils are equal, round, and reactive to light.     Comments: Blue eyes.   Neck:     Vascular: No JVD.  Cardiovascular:     Rate and Rhythm: Normal rate and regular rhythm.     Pulses: Intact distal pulses.      Heart sounds: Normal heart sounds. No murmur heard.  No friction rub. No gallop.   Pulmonary:     Effort: Pulmonary effort is normal. No respiratory distress.     Breath sounds: Normal breath sounds. No wheezing or rales.  Abdominal:     General: Bowel sounds are normal. There is no distension.     Palpations: Abdomen is soft. There is no mass.     Tenderness: There is no abdominal tenderness. There is no guarding or rebound.  Musculoskeletal:        General: No tenderness or edema. Normal range of motion.     Cervical back: Normal range of motion and neck supple.  Lymphadenopathy:  Head:     Right side of head: No preauricular, posterior auricular or occipital adenopathy.     Left side of head: No preauricular, posterior auricular or occipital adenopathy.     Cervical: No cervical adenopathy.     Upper Body:  No axillary adenopathy present.    Right upper body: No supraclavicular adenopathy.     Left upper body: No supraclavicular adenopathy.  Skin:    General: Skin is warm and dry.     Coloration: Skin is not pale.     Findings: No erythema or rash.  Neurological:     Mental Status: She is alert and oriented to person, place, and time.  Psychiatric:        Mood and Affect: Mood and affect normal.        Behavior: Behavior normal.        Thought Content: Thought content normal.        Judgment: Judgment normal.    Appointment on 01/04/2020  Component Date Value Ref Range Status  . Ferritin 01/04/2020 57  11 - 307 ng/mL Final   Performed at [facility], [address] [place], [place] [address]  . WBC 01/04/2020 6.5  4.0 - 10.5 K/uL Final  . RBC 01/04/2020 4.24  3.87 - 5.11 MIL/uL Final  . Hemoglobin 01/04/2020 12.4  12.0 - 15.0 g/dL Final  . HCT 01/04/2020 37.7  36.0 - 46.0 % Final  . MCV 01/04/2020 88.9  80.0 - 100.0 fL Final  . MCH 01/04/2020 29.2  26.0 - 34.0 pg Final  . MCHC 01/04/2020 32.9  30.0 - 36.0 g/dL Final  . RDW 01/04/2020 13.3  11.5 - 15.5 %  Final  . Platelets 01/04/2020 274  150 - 400 K/uL Final  . nRBC 01/04/2020 0.0  0.0 - 0.2 % Final  . Neutrophils Relative % 01/04/2020 63  % Final  . Neutro Abs 01/04/2020 4.2  1.7 - 7.7 K/uL Final  . Lymphocytes Relative 01/04/2020 26  % Final  . Lymphs Abs 01/04/2020 1.7  0.7 - 4.0 K/uL Final  . Monocytes Relative 01/04/2020 7  % Final  . Monocytes Absolute 01/04/2020 0.5  0.1 - 1.0 K/uL Final  . Eosinophils Relative 01/04/2020 2  % Final  . Eosinophils Absolute 01/04/2020 0.1  0.0 - 0.5 K/uL Final  . Basophils Relative 01/04/2020 1  % Final  . Basophils Absolute 01/04/2020 0.0  0.0 - 0.1 K/uL Final  . Immature Granulocytes 01/04/2020 1  % Final  . Abs Immature Granulocytes 01/04/2020 0.05  0.00 - 0.07 K/uL Final   Performed at [facility], [address] [place], [place] [address]    Assessment:  ALAYCIA EARDLEY is a 44 y.o. female with a long standing history ofiron deficiency anemia. Dietis modest. She is unable to tolerate oral iron. She has chronic diarrhea with some dark stools.  Work-up on 04/30/2018 revealed a hematocrit 23.9, hemoglobin 7.0, and MCV 64.5. Ferritin was 2. Iron saturation was 2% with a TIBC of 499. Reticulocyte count was 2.5%. B12 was 184. Normal studies included: TSH, ANA, and folate (13.5). Urinalysis on 05/07/2018 revealed no hematuria.   She has received IV ironin the past. She received 1 unit of PRBCson 05/01/2018. She received Venoferweekly x 4 (04/30/2018 - 05/21/2018),x 2 (06/12/2018 and 06/23/2018), and x4 (12/22/2018 - 01/12/2019), 07/15/2019, and weekly x 3 (09/15/2019 - 09/30/2019).  Ferritinhas been followed: 4 on 08/14/2011, 133 on 10/24/2011, 125 on 12/17/2011, 18 on 10/08/2012, 3 on 04/24/2018,  2 on 04/30/2018, 52 on 06/11/2018, 63 on 08/11/2018, 33 on 11/16/2018, 19 on 12/15/2018, 80 on 02/16/2019, 46 on 03/31/2019, 33 on 05/21/2019, 21 on 07/07/2019, 13 on 09/10/2019, 99 on 11/10/2019 and 57 on  01/04/2020.  EGD on 05/11/2018 revealed erythematous mucosa in the antrum (minmal chronic gastritis and mild foveolar hyperplasia) and a 1 cm hiatal hernia. Colonoscopyon 05/11/2018 was normal. Random biopsies revealed no abnormalities.Capsule enteroscopyon 06/15/2018 was normal.  She has B12 deficiency. Intrinsic factor and anti-parietal cell antibody were normal on 06/12/2018. She began oral B12 on 05/01/2018. She began B12 injections on 06/12/2018 (last 12/09/2019).B12 was 184 on 04/30/2018,316 on 06/11/2018, and 380 on 09/22/2018.Folate was 13.5 on 04/30/2018 and 8.2 on 02/17/2019.  Symptomatically, she feels that she is becoming fatigued.  Exam is stable.  Plan: 1.   Review labs from 01/04/2020.  2.   Labs today:  folate, B12, MMA. 3.Iron deficiency anemia Hematocrit 37.7.  Hemoglobin12.4. MCV 88.9. Ferritin57. PriorEGD revealed mild gastritis. Colonoscopy and capsule study were normal. Guaiac card x 1 + (unclear significance with hemorrhoids). Urinalysiswas negative on 12/15/2018. Patient notes symptoms when her ferritin is < 100.  Venofer today and weekly x 1 (total 2).   Urine pregnancy test. 4. B12 deficiency B12 today and monthly (last 01/05/2020).             Check folate annually. 5.   Fatigue            TSH and free T4 were normal on 09/15/2019.  Check B12 and MMA per patient request. 6.   RTC in 3 months for labs (CBC and ferritin). 7.   RTC in 6 months for MD assessment, labs (CBC with diff, ferritin- day before), and +/- Venofer.  I discussed the assessment and treatment plan with the patient.  The patient was provided an opportunity to ask questions and all were answered.  The patient agreed with the plan and demonstrated an understanding of the instructions.  The patient was advised to call back if the symptoms worsen or if the condition fails to  improve as anticipated.   Lequita Asal, MD, PhD    01/05/2020, 2:03 PM   I, Heywood Footman, am acting as a Education administrator for Lequita Asal, MD.  I, [place] Mike Gip, MD, have reviewed the above documentation for accuracy and completeness, and I agree with the above.

## 2020-01-04 ENCOUNTER — Inpatient Hospital Stay: Payer: 59 | Attending: Hematology and Oncology

## 2020-01-04 ENCOUNTER — Other Ambulatory Visit: Payer: Self-pay

## 2020-01-04 DIAGNOSIS — R5383 Other fatigue: Secondary | ICD-10-CM

## 2020-01-04 DIAGNOSIS — D509 Iron deficiency anemia, unspecified: Secondary | ICD-10-CM

## 2020-01-04 DIAGNOSIS — E538 Deficiency of other specified B group vitamins: Secondary | ICD-10-CM

## 2020-01-04 LAB — CBC WITH DIFFERENTIAL/PLATELET
Abs Immature Granulocytes: 0.05 10*3/uL (ref 0.00–0.07)
Basophils Absolute: 0 10*3/uL (ref 0.0–0.1)
Basophils Relative: 1 %
Eosinophils Absolute: 0.1 10*3/uL (ref 0.0–0.5)
Eosinophils Relative: 2 %
HCT: 37.7 % (ref 36.0–46.0)
Hemoglobin: 12.4 g/dL (ref 12.0–15.0)
Immature Granulocytes: 1 %
Lymphocytes Relative: 26 %
Lymphs Abs: 1.7 10*3/uL (ref 0.7–4.0)
MCH: 29.2 pg (ref 26.0–34.0)
MCHC: 32.9 g/dL (ref 30.0–36.0)
MCV: 88.9 fL (ref 80.0–100.0)
Monocytes Absolute: 0.5 10*3/uL (ref 0.1–1.0)
Monocytes Relative: 7 %
Neutro Abs: 4.2 10*3/uL (ref 1.7–7.7)
Neutrophils Relative %: 63 %
Platelets: 274 10*3/uL (ref 150–400)
RBC: 4.24 MIL/uL (ref 3.87–5.11)
RDW: 13.3 % (ref 11.5–15.5)
WBC: 6.5 10*3/uL (ref 4.0–10.5)
nRBC: 0 % (ref 0.0–0.2)

## 2020-01-04 LAB — FERRITIN: Ferritin: 57 ng/mL (ref 11–307)

## 2020-01-05 ENCOUNTER — Inpatient Hospital Stay: Payer: 59

## 2020-01-05 ENCOUNTER — Encounter: Payer: Self-pay | Admitting: Hematology and Oncology

## 2020-01-05 ENCOUNTER — Inpatient Hospital Stay: Payer: 59 | Admitting: Hematology and Oncology

## 2020-01-05 VITALS — BP 117/77 | HR 81 | Resp 18 | Wt 154.3 lb

## 2020-01-05 VITALS — BP 114/75 | HR 88 | Resp 18

## 2020-01-05 DIAGNOSIS — D509 Iron deficiency anemia, unspecified: Secondary | ICD-10-CM | POA: Diagnosis not present

## 2020-01-05 DIAGNOSIS — D500 Iron deficiency anemia secondary to blood loss (chronic): Secondary | ICD-10-CM

## 2020-01-05 DIAGNOSIS — E538 Deficiency of other specified B group vitamins: Secondary | ICD-10-CM

## 2020-01-05 LAB — PREGNANCY, URINE: Preg Test, Ur: NEGATIVE

## 2020-01-05 LAB — VITAMIN B12: Vitamin B-12: 448 pg/mL (ref 180–914)

## 2020-01-05 LAB — FOLATE: Folate: 9.2 ng/mL (ref >5.9)

## 2020-01-05 MED ORDER — IRON SUCROSE 20 MG/ML IV SOLN
200.0000 mg | Freq: Once | INTRAVENOUS | Status: AC
  Administered 2020-01-05: 200 mg via INTRAVENOUS
  Filled 2020-01-05: qty 10

## 2020-01-05 MED ORDER — CYANOCOBALAMIN 1000 MCG/ML IJ SOLN
1000.0000 ug | Freq: Once | INTRAMUSCULAR | Status: AC
  Administered 2020-01-05: 1000 ug via INTRAMUSCULAR
  Filled 2020-01-05: qty 1

## 2020-01-05 MED ORDER — SODIUM CHLORIDE 0.9 % IV SOLN
Freq: Once | INTRAVENOUS | Status: AC
  Filled 2020-01-05: qty 250

## 2020-01-05 NOTE — Progress Notes (Signed)
Patient states she felt the best she has in 3 years when she got the 3 weekly iron infusions (about a month ago). She thinks she needs to get them more.

## 2020-01-12 ENCOUNTER — Inpatient Hospital Stay: Payer: 59

## 2020-01-12 ENCOUNTER — Other Ambulatory Visit: Payer: Self-pay

## 2020-01-12 VITALS — BP 113/79 | HR 80 | Temp 97.3°F | Resp 16

## 2020-01-12 DIAGNOSIS — D509 Iron deficiency anemia, unspecified: Secondary | ICD-10-CM | POA: Diagnosis not present

## 2020-01-12 DIAGNOSIS — D500 Iron deficiency anemia secondary to blood loss (chronic): Secondary | ICD-10-CM

## 2020-01-12 LAB — METHYLMALONIC ACID, SERUM: Methylmalonic Acid, Quantitative: 129 nmol/L (ref 0–378)

## 2020-01-12 MED ORDER — IRON SUCROSE 20 MG/ML IV SOLN
200.0000 mg | Freq: Once | INTRAVENOUS | Status: AC
  Administered 2020-01-12: 200 mg via INTRAVENOUS
  Filled 2020-01-12: qty 10

## 2020-01-12 MED ORDER — SODIUM CHLORIDE 0.9 % IV SOLN
Freq: Once | INTRAVENOUS | Status: AC
  Filled 2020-01-12: qty 250

## 2020-04-06 ENCOUNTER — Other Ambulatory Visit: Payer: 59

## 2020-04-11 ENCOUNTER — Other Ambulatory Visit: Payer: Self-pay

## 2020-04-11 ENCOUNTER — Inpatient Hospital Stay: Payer: 59 | Attending: Hematology and Oncology

## 2020-04-11 DIAGNOSIS — D509 Iron deficiency anemia, unspecified: Secondary | ICD-10-CM | POA: Diagnosis present

## 2020-04-11 DIAGNOSIS — E538 Deficiency of other specified B group vitamins: Secondary | ICD-10-CM

## 2020-04-11 LAB — CBC WITH DIFFERENTIAL/PLATELET
Abs Immature Granulocytes: 0.04 10*3/uL (ref 0.00–0.07)
Basophils Absolute: 0 10*3/uL (ref 0.0–0.1)
Basophils Relative: 0 %
Eosinophils Absolute: 0.1 10*3/uL (ref 0.0–0.5)
Eosinophils Relative: 2 %
HCT: 38 % (ref 36.0–46.0)
Hemoglobin: 13 g/dL (ref 12.0–15.0)
Immature Granulocytes: 1 %
Lymphocytes Relative: 24 %
Lymphs Abs: 1.8 10*3/uL (ref 0.7–4.0)
MCH: 30 pg (ref 26.0–34.0)
MCHC: 34.2 g/dL (ref 30.0–36.0)
MCV: 87.6 fL (ref 80.0–100.0)
Monocytes Absolute: 0.5 10*3/uL (ref 0.1–1.0)
Monocytes Relative: 7 %
Neutro Abs: 4.8 10*3/uL (ref 1.7–7.7)
Neutrophils Relative %: 66 %
Platelets: 267 10*3/uL (ref 150–400)
RBC: 4.34 MIL/uL (ref 3.87–5.11)
RDW: 12.7 % (ref 11.5–15.5)
WBC: 7.3 10*3/uL (ref 4.0–10.5)
nRBC: 0 % (ref 0.0–0.2)

## 2020-04-11 LAB — FERRITIN: Ferritin: 102 ng/mL (ref 11–307)

## 2020-06-24 IMAGING — MG MM DIGITAL SCREENING BILAT W/ TOMO W/ CAD
8 series · 9 of 24 positions shown · non-contrast
Comparison: Previous exam(s).

CLINICAL DATA: Screening.

EXAM:
DIGITAL SCREENING BILATERAL MAMMOGRAM WITH TOMO AND CAD

[R MLO synth-2D]
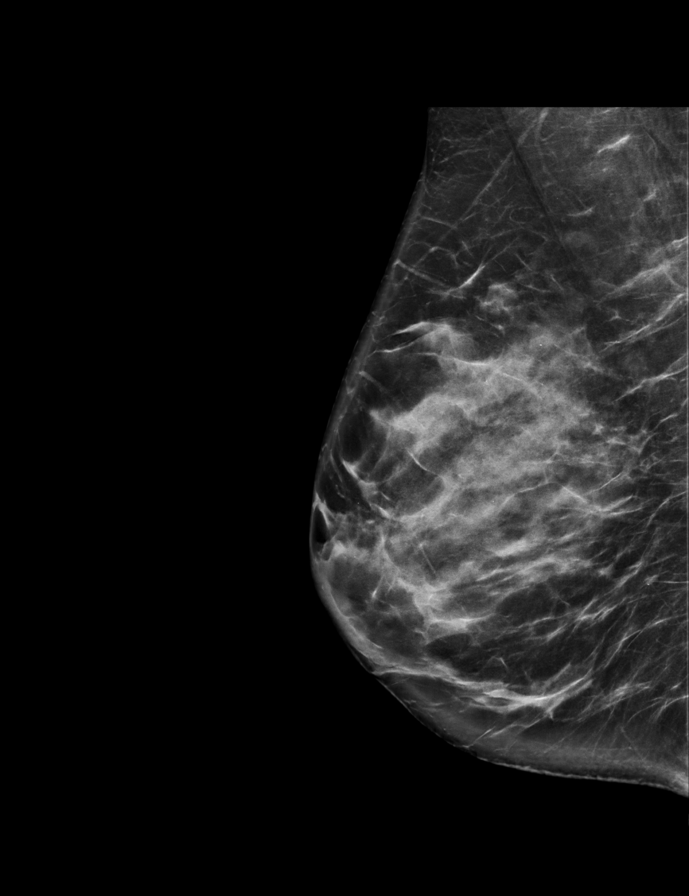

[L MLO synth-2D]
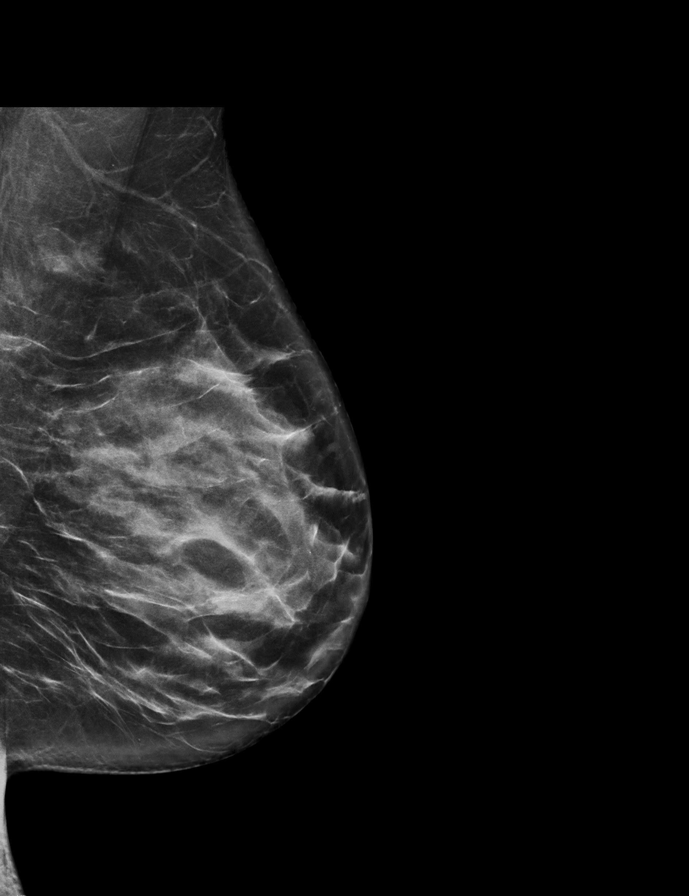

[L CC synth-2D]
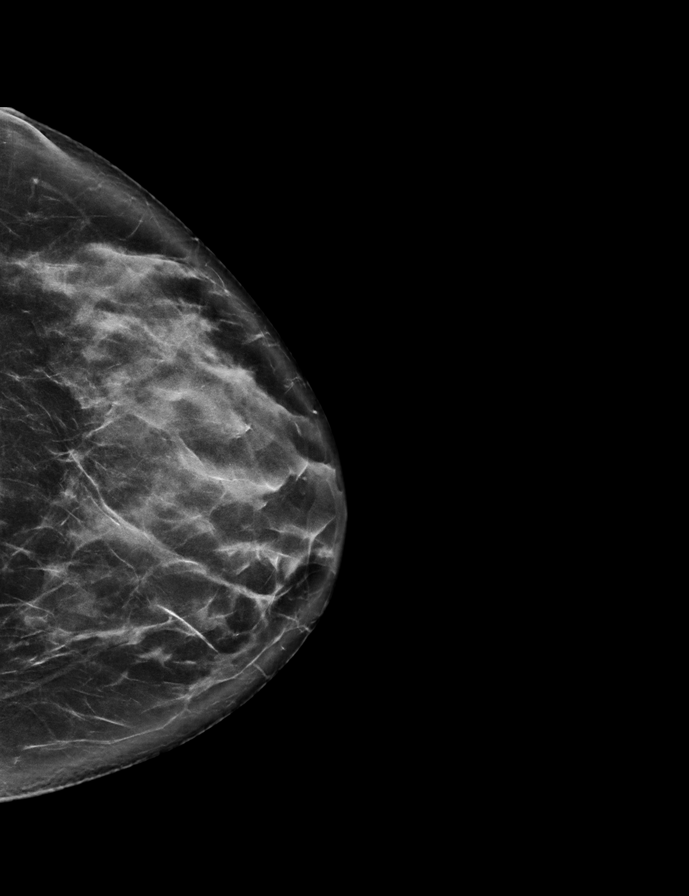

[R CC synth-2D]
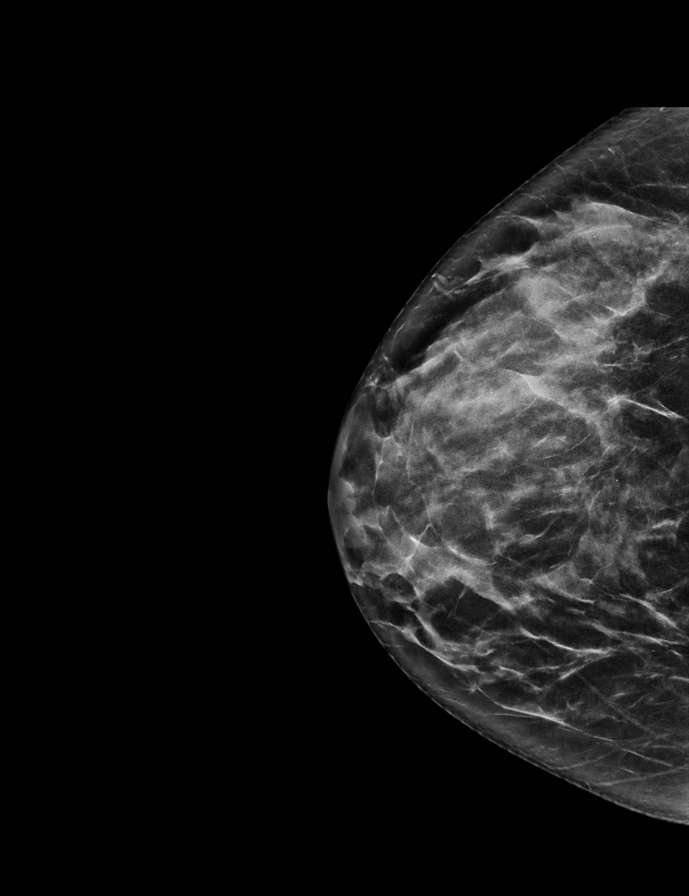

[R MLO tomo · 2 of 74 frames shown]
[frame 24/74]
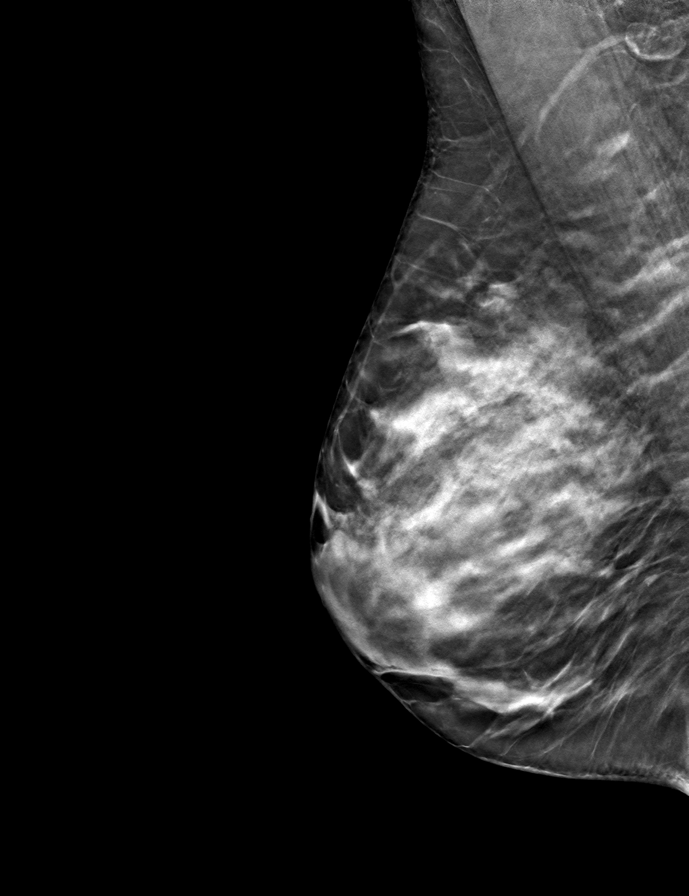
[frame 37/74]
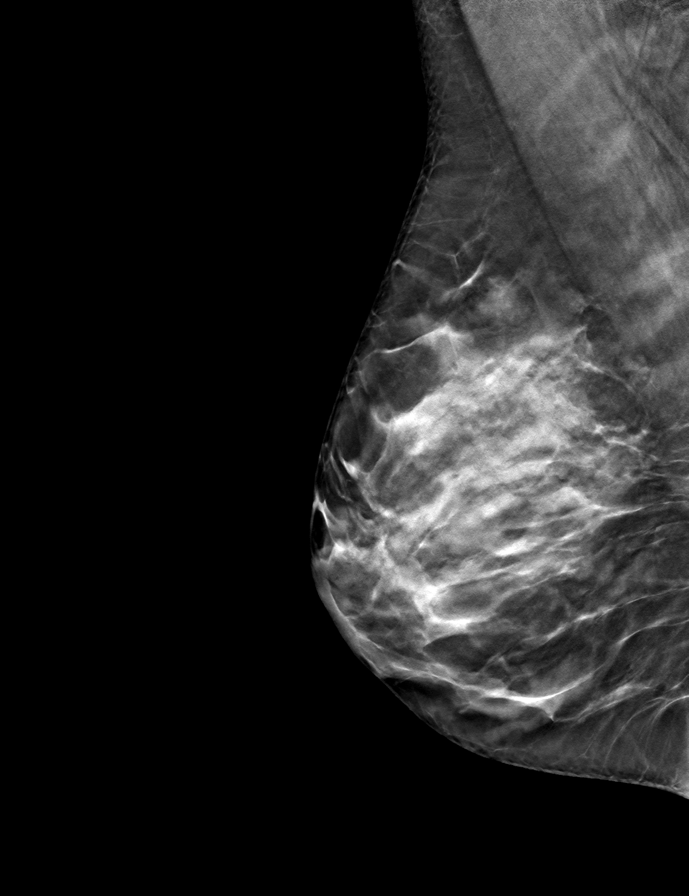

[R CC tomo · tomo slice 30/59.0]
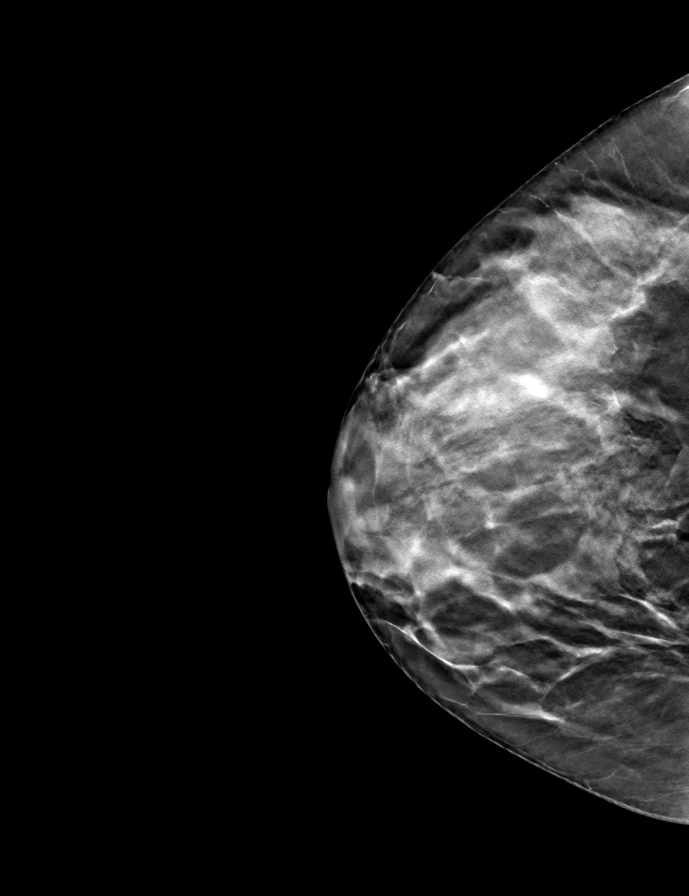

[L CC tomo · tomo slice 36/71.0]
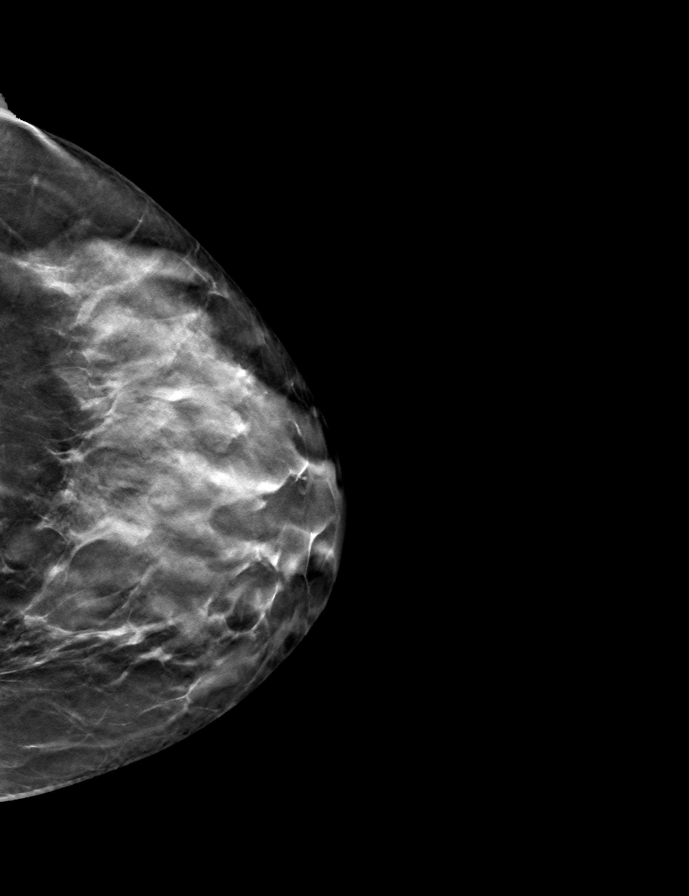

[L MLO tomo · tomo slice 37/73.0]
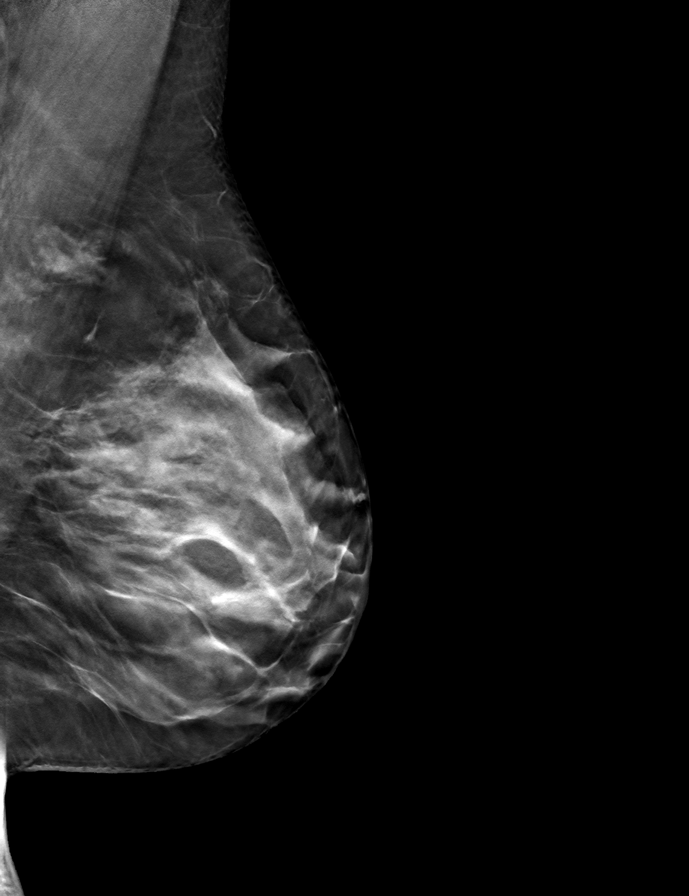

[9 of 24 positions shown; findings below may reference images not displayed]

ACR Breast Density Category c: The breast tissue is heterogeneously
dense, which may obscure small masses.
FINDINGS: There are no findings suspicious for malignancy. Images were
processed with CAD.
IMPRESSION: No mammographic evidence of malignancy. A result letter of this
screening mammogram will be mailed directly to the patient.

RECOMMENDATION:
Screening mammogram in one year. (Code:FT-U-LHB)

BI-RADS CATEGORY  1: Negative.

## 2020-07-06 ENCOUNTER — Inpatient Hospital Stay: Payer: 59 | Admitting: Hematology and Oncology

## 2020-07-06 ENCOUNTER — Inpatient Hospital Stay: Payer: 59

## 2020-07-06 ENCOUNTER — Inpatient Hospital Stay: Payer: 59 | Attending: Hematology and Oncology

## 2020-07-06 ENCOUNTER — Other Ambulatory Visit: Payer: Self-pay

## 2020-07-06 DIAGNOSIS — E538 Deficiency of other specified B group vitamins: Secondary | ICD-10-CM | POA: Insufficient documentation

## 2020-07-06 DIAGNOSIS — D509 Iron deficiency anemia, unspecified: Secondary | ICD-10-CM | POA: Insufficient documentation

## 2020-07-06 LAB — CBC WITH DIFFERENTIAL/PLATELET
Abs Immature Granulocytes: 0.04 10*3/uL (ref 0.00–0.07)
Basophils Absolute: 0 10*3/uL (ref 0.0–0.1)
Basophils Relative: 1 %
Eosinophils Absolute: 0.1 10*3/uL (ref 0.0–0.5)
Eosinophils Relative: 2 %
HCT: 39.1 % (ref 36.0–46.0)
Hemoglobin: 13.4 g/dL (ref 12.0–15.0)
Immature Granulocytes: 1 %
Lymphocytes Relative: 27 %
Lymphs Abs: 1.6 10*3/uL (ref 0.7–4.0)
MCH: 29.8 pg (ref 26.0–34.0)
MCHC: 34.3 g/dL (ref 30.0–36.0)
MCV: 87.1 fL (ref 80.0–100.0)
Monocytes Absolute: 0.5 10*3/uL (ref 0.1–1.0)
Monocytes Relative: 9 %
Neutro Abs: 3.8 10*3/uL (ref 1.7–7.7)
Neutrophils Relative %: 60 %
Platelets: 273 10*3/uL (ref 150–400)
RBC: 4.49 MIL/uL (ref 3.87–5.11)
RDW: 12.5 % (ref 11.5–15.5)
WBC: 6.1 10*3/uL (ref 4.0–10.5)
nRBC: 0 % (ref 0.0–0.2)

## 2020-07-06 LAB — FERRITIN: Ferritin: 74 ng/mL (ref 11–307)

## 2020-07-11 ENCOUNTER — Other Ambulatory Visit: Payer: Self-pay

## 2020-07-11 ENCOUNTER — Inpatient Hospital Stay: Payer: 59

## 2020-07-11 ENCOUNTER — Inpatient Hospital Stay: Payer: 59 | Admitting: Hematology and Oncology

## 2020-07-11 DIAGNOSIS — E538 Deficiency of other specified B group vitamins: Secondary | ICD-10-CM

## 2020-07-11 DIAGNOSIS — D509 Iron deficiency anemia, unspecified: Secondary | ICD-10-CM

## 2020-08-14 NOTE — Progress Notes (Signed)
[facility]  [address] [place], [place] [address] Phone: [phone]  Fax: [phone]   Telemedicine Office Visit:  08/15/2020  Referring physician: Benjaman Kindler, MD  I connected with Alicia Washington on 08/15/2020 at 3:18 PM by videoconferencing and verified that I was speaking with the correct person using 2 identifiers.  The patient was in her classroom.  I discussed the limitations, risk, security and privacy concerns of performing an evaluation and management service by videoconferencing and the availability of in person appointments.  I also discussed with the patient that there may be a patient responsible charge related to this service.  The patient expressed understanding and agreed to proceed.   Chief Complaint: Alicia Washington is a 45 y.o. female with iron deficiency and B12 deficiency who is seen for 6 month assessment.    HPI: The patient was last seen in the hematology clinic on 01/05/2020. At that time, she felt that she was becoming fatigued.  Exam was stable.  Hematocrit was 37.7, hemoglobin 12.4, MCV 88.9, platelets 274,000, WBC 6500 (Term[place] 4200).  Ferritin was 57.  Vitamin B12 was 448 and folate was 9.2.   MMMA was 129 (normal). She received a vitamin B12 injection.  She received Venofer weekly x 2 (01/05/2020 - 01/12/2020).   Labs have been followed: 04/11/2020:  Hematocrit 38.0, hemoglobin 13.0, MCV 87.6, platelets 267,000, WBC 7,300.  Ferritin 102. 07/06/2020:  Hematocrit 39.1, hemoglobin 13.4, MCV 87.1, platelets 273,000, WBC 6,100.  Ferritin   74.  During the interim, she has been "good." Her energy level is better than it has been. She does not have as much energy as she would like to but she has been much worse in the past. Her shortness of breath and palpitations have resolved. She denies bleeding of any kind.  If her B12 is low, she would like to restart B12 injections.   Past Medical History:  Diagnosis Date  . Cancer  ([place])    skin  . Fibromyalgia   . Low iron     Past Surgical History:  Procedure Laterality Date  . BREAST BIOPSY Left    core- neg  . CESAREAN SECTION    . ENDOMETRIAL ABLATION      Family History  Problem Relation Age of Onset  . Cancer Mother   . Cancer Maternal Grandfather   . Breast cancer Neg Hx     Social History:  reports that she has never smoked. She has never used smokeless tobacco. She reports that she does not drink alcohol and does not use drugs. She has 2 children (daughter 58, son 26). 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 She lives in [place]. 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  Participants in the patient's visit and their role in the encounter included the patient and Alicia Washington, [place], today.  The intake visit was provided by Alicia Washington, CMA  Allergies: No Known Allergies  Current Medications: Current Outpatient Medications  Medication Sig Dispense Refill  . sertraline (ZOLOFT) 100 MG tablet Take 100 mg by mouth daily.     . Cyanocobalamin 1000 MCG/ML KIT Inject 1,000 mcg as directed. (Patient not taking: Reported on 08/15/2020)    . fluticasone (FLONASE) 50 MCG/ACT nasal spray Place into the nose. (Patient not taking: Reported on 08/15/2020)    . IRON SUCROSE IV Inject into the vein every 30 (thirty) days. (Patient not taking: Reported on 08/15/2020)    .  loratadine (CLARITIN) 10 MG tablet Take by mouth. (Patient not taking: Reported on 08/15/2020)    . omeprazole (PRILOSEC) 40 MG capsule Take 40 mg by mouth daily. (Patient not taking: Reported on 08/15/2020)  1  . predniSONE (DELTASONE) 20 MG tablet 3 tabs po qd x 2 days, then 2 tabs po qd x 3 days, then 1 tab po qd x 3 days, then half a tab po qd x 2 days (Patient not taking: No sig reported) 16 tablet 0   No current facility-administered medications for this visit.   Facility-Administered Medications Ordered in Other Visits  Medication Dose Route  Frequency Provider Last Rate Last Admin  . sodium chloride flush (NS) 0.9 % injection 10 mL  10 mL Intracatheter Once PRN Karen Kitchens, NP      . sodium chloride flush (NS) 0.9 % injection 3 mL  3 mL Intracatheter Once PRN Karen Kitchens, NP        Review of Systems  Constitutional: Negative for chills, diaphoresis, fever and malaise/fatigue. Weight loss: no new weight.       Feels "good". Energy is much better than usual.  HENT: Negative.  Negative for congestion, ear discharge, ear pain, hearing loss, nosebleeds, sinus pain, sore throat and tinnitus.   Eyes: Negative.  Negative for blurred vision.  Respiratory: Negative.  Negative for cough, hemoptysis, sputum production and shortness of breath.   Cardiovascular: Negative.  Negative for chest pain, palpitations and leg swelling.  Gastrointestinal: Negative.  Negative for abdominal pain, blood in stool, constipation, diarrhea, heartburn, melena, nausea and vomiting.  Genitourinary: Negative.  Negative for dysuria, frequency, hematuria and urgency.       No periods.  Musculoskeletal: Negative.  Negative for back pain, joint pain, myalgias and neck pain.  Skin: Negative.  Negative for itching and rash.  Neurological: Negative.  Negative for dizziness, tremors, sensory change, speech change, focal weakness, weakness and headaches.  Endo/Heme/Allergies: Negative.   Psychiatric/Behavioral: Negative.  Negative for depression and memory loss. The patient is not nervous/anxious and does not have insomnia.   All other systems reviewed and are negative.  Performance status (ECOG): 0  Vitals There were no vitals taken for this visit.   Physical Exam Vitals and nursing note reviewed.  Constitutional:      General: She is not in acute distress.    Appearance: She is well-developed. She is not diaphoretic.  HENT:     Head: Normocephalic and atraumatic.     Comments: Long brown hair.  Glasses propped on head. Eyes:     General: No scleral  icterus.    Conjunctiva/sclera: Conjunctivae normal.     Comments: Blue eyes.   Neurological:     Mental Status: She is alert and oriented to person, place, and time.  Psychiatric:        Behavior: Behavior normal.        Thought Content: Thought content normal.        Judgment: Judgment normal.    No visits with results within 3 Day(s) from this visit.  Latest known visit with results is:  Appointment on 07/06/2020  Component Date Value Ref Range Status  . Ferritin 07/06/2020 74  11 - 307 ng/mL Final   Performed at [facility], [address] [place], [place] [address]  . WBC 07/06/2020 6.1  4.0 - 10.5 K/uL Final  . RBC 07/06/2020 4.49  3.87 - 5.11 MIL/uL Final  . Hemoglobin 07/06/2020 13.4  12.0 - 15.0 g/dL Final  .  HCT 07/06/2020 39.1  36.0 - 46.0 % Final  . MCV 07/06/2020 87.1  80.0 - 100.0 fL Final  . MCH 07/06/2020 29.8  26.0 - 34.0 pg Final  . MCHC 07/06/2020 34.3  30.0 - 36.0 g/dL Final  . RDW 07/06/2020 12.5  11.5 - 15.5 % Final  . Platelets 07/06/2020 273  150 - 400 K/uL Final  . nRBC 07/06/2020 0.0  0.0 - 0.2 % Final  . Neutrophils Relative % 07/06/2020 60  % Final  . Neutro Abs 07/06/2020 3.8  1.7 - 7.7 K/uL Final  . Lymphocytes Relative 07/06/2020 27  % Final  . Lymphs Abs 07/06/2020 1.6  0.7 - 4.0 K/uL Final  . Monocytes Relative 07/06/2020 9  % Final  . Monocytes Absolute 07/06/2020 0.5  0.1 - 1.0 K/uL Final  . Eosinophils Relative 07/06/2020 2  % Final  . Eosinophils Absolute 07/06/2020 0.1  0.0 - 0.5 K/uL Final  . Basophils Relative 07/06/2020 1  % Final  . Basophils Absolute 07/06/2020 0.0  0.0 - 0.1 K/uL Final  . Immature Granulocytes 07/06/2020 1  % Final  . Abs Immature Granulocytes 07/06/2020 0.04  0.00 - 0.07 K/uL Final   Performed at [facility], [address], [place], [place] [address]    Assessment:  JAEDA BRUSO is a 45 y.o. female with a long standing history ofiron deficiency anemia. Dietis modest. She is unable  to tolerate oral iron. She has chronic diarrhea with some dark stools.  Work-up on 04/30/2018 revealed a hematocrit 23.9, hemoglobin 7.0, and MCV 64.5. Ferritin was 2. Iron saturation was 2% with a TIBC of 499. Reticulocyte count was 2.5%. B12 was 184. Normal studies included: TSH, ANA, and folate (13.5). Urinalysis on 05/07/2018 revealed no hematuria.   She has received IV ironin the past. She received 1 unit of PRBCson 05/01/2018. She received Venoferweekly x 4 (04/30/2018 - 05/21/2018),x 2 (06/12/2018 and 06/23/2018), x4 (12/22/2018 - 01/12/2019), 07/15/2019, x 3 (09/15/2019 - 09/30/2019), and x 2 (01/05/2020 - 01/12/2020).  Ferritinhas been followed: 4 on 08/14/2011, 133 on 10/24/2011, 125 on 12/17/2011, 18 on 10/08/2012, 3 on 04/24/2018, 2 on 04/30/2018, 52 on 06/11/2018, 63 on 08/11/2018, 33 on 11/16/2018, 19 on 12/15/2018, 80 on 02/16/2019, 46 on 03/31/2019, 33 on 05/21/2019, 21 on 07/07/2019, 13 on 09/10/2019, 99 on 11/10/2019, 57 on 01/04/2020, 102 on 04/11/2020, and 74 on 07/06/2020.  She receives Venofer when ferritin < 100.  EGD on 05/11/2018 revealed erythematous mucosa in the antrum (minmal chronic gastritis and mild foveolar hyperplasia) and a 1 cm hiatal hernia. Colonoscopyon 05/11/2018 was normal. Random biopsies revealed no abnormalities.Capsule enteroscopyon 06/15/2018 was normal.  Urinalysis on 05/17/2018 and 12/15/2018 revealed no hematuria.  She has B12 deficiency. Intrinsic factor and anti-parietal cell antibody were normal on 06/12/2018. She began oral B12 on 05/01/2018. She began B12 injections on 06/12/2018 (last 01/05/2020).B12 was 184 on 04/30/2018,316 on 06/11/2018, 380 on 09/22/2018, and 448 on 01/05/2020.Folate was 9.2 on 01/05/2020.  Symptomatically,  she feels "good." Her energy level is better. Shortness of breath and palpitations have resolved. She denies any bleeding.   Plan: 1.   Review labs from 07/06/2020. 2.Iron deficiency  anemia Hematocrit 39.1.  Hemoglobin13.4. MCV  87.1 on 07/06/2020.  [id number]. PriorEGD revealed mild gastritis. Colonoscopy and capsule study were normal.. She denies any menses. She received Venofer x2 (last 01/12/2020).  Continue to monitor. 3. B12 deficiency B12 was 448 on 01/05/2020.  B12 goal is 400.  Continue to monitor. 4.   RTC  on 10/04/2020 for MD assessment, labs (CBC with diff, ferritin, B12, folate- day before) and +/- Venofer.  I discussed the assessment and treatment plan with the patient.  The patient was provided an opportunity to ask questions and all were answered.  The patient agreed with the plan and demonstrated an understanding of the instructions.  The patient was advised to call back if the symptoms worsen or if the condition fails to improve as anticipated.  I provided 13 minutes (3:18 PM - 3:31 PM) of face-to-face video visit time during this this encounter and > 50% was spent counseling as documented under my assessment and plan.  I provided these services from the [facility] office.   Lequita Asal, MD, PhD    08/15/2020, 3:18 PM   I, Evert Kohl, am acting as a Education administrator for Lequita Asal, MD.  I, [place] Mike Gip, MD, have reviewed the above documentation for accuracy and completeness, and I agree with the above.

## 2020-08-15 ENCOUNTER — Encounter: Payer: Self-pay | Admitting: Hematology and Oncology

## 2020-08-15 ENCOUNTER — Inpatient Hospital Stay: Payer: 59 | Attending: Hematology and Oncology | Admitting: Hematology and Oncology

## 2020-08-15 ENCOUNTER — Other Ambulatory Visit: Payer: Self-pay

## 2020-08-15 ENCOUNTER — Telehealth: Payer: Self-pay | Admitting: Hematology and Oncology

## 2020-08-15 DIAGNOSIS — R5383 Other fatigue: Secondary | ICD-10-CM | POA: Insufficient documentation

## 2020-08-15 DIAGNOSIS — D509 Iron deficiency anemia, unspecified: Secondary | ICD-10-CM | POA: Diagnosis not present

## 2020-08-15 DIAGNOSIS — E538 Deficiency of other specified B group vitamins: Secondary | ICD-10-CM

## 2020-08-15 NOTE — Progress Notes (Signed)
Patient states she would like to know when she wll ave another b12 injection and iron infusion.

## 2020-08-15 NOTE — Telephone Encounter (Signed)
Left voice mail about upcoming appt. for 3/22

## 2020-09-04 NOTE — Progress Notes (Signed)
This encounter was created in error - please disregard.

## 2020-09-19 ENCOUNTER — Other Ambulatory Visit: Payer: Self-pay | Admitting: Obstetrics and Gynecology

## 2020-09-19 DIAGNOSIS — Z1231 Encounter for screening mammogram for malignant neoplasm of breast: Secondary | ICD-10-CM

## 2020-09-20 ENCOUNTER — Inpatient Hospital Stay: Payer: 59 | Attending: Hematology and Oncology

## 2020-09-20 DIAGNOSIS — D509 Iron deficiency anemia, unspecified: Secondary | ICD-10-CM | POA: Insufficient documentation

## 2020-09-20 DIAGNOSIS — E538 Deficiency of other specified B group vitamins: Secondary | ICD-10-CM | POA: Diagnosis not present

## 2020-09-20 LAB — CBC WITH DIFFERENTIAL/PLATELET
Abs Immature Granulocytes: 0.06 10*3/uL (ref 0.00–0.07)
Basophils Absolute: 0 10*3/uL (ref 0.0–0.1)
Basophils Relative: 0 %
Eosinophils Absolute: 0.1 10*3/uL (ref 0.0–0.5)
Eosinophils Relative: 1 %
HCT: 40.6 % (ref 36.0–46.0)
Hemoglobin: 13.4 g/dL (ref 12.0–15.0)
Immature Granulocytes: 1 %
Lymphocytes Relative: 23 %
Lymphs Abs: 1.8 10*3/uL (ref 0.7–4.0)
MCH: 28.9 pg (ref 26.0–34.0)
MCHC: 33 g/dL (ref 30.0–36.0)
MCV: 87.7 fL (ref 80.0–100.0)
Monocytes Absolute: 0.4 10*3/uL (ref 0.1–1.0)
Monocytes Relative: 5 %
Neutro Abs: 5.4 10*3/uL (ref 1.7–7.7)
Neutrophils Relative %: 70 %
Platelets: 276 10*3/uL (ref 150–400)
RBC: 4.63 MIL/uL (ref 3.87–5.11)
RDW: 12.7 % (ref 11.5–15.5)
WBC: 7.7 10*3/uL (ref 4.0–10.5)
nRBC: 0 % (ref 0.0–0.2)

## 2020-09-20 LAB — FERRITIN: Ferritin: 64 ng/mL (ref 11–307)

## 2020-09-20 LAB — FOLATE: Folate: 8.2 ng/mL (ref >5.9)

## 2020-09-21 ENCOUNTER — Other Ambulatory Visit: Payer: Self-pay | Admitting: Hematology and Oncology

## 2020-09-21 ENCOUNTER — Telehealth: Payer: Self-pay

## 2020-09-21 LAB — VITAMIN B12: Vitamin B-12: 254 pg/mL (ref 180–914)

## 2020-10-02 ENCOUNTER — Other Ambulatory Visit: Payer: 59

## 2020-10-03 NOTE — Progress Notes (Signed)
[facility]  [address], [address] [place], [place] [address] Phone: [phone]  Fax: [phone]   Clinic Day: 10/04/2020  Referring physician: Benjaman Kindler, MD  Chief Complaint: Alicia Washington is a 45 y.o. female with iron deficiency and B12 deficiency who is seen for 6 week assessment.    HPI: The patient was last seen in the hematology clinic on 08/15/2020 via telemedicine. At that time, she felt "good." Her energy level was better. Shortness of breath and palpitations had resolved. She denied any bleeding.  Labs on 09/20/2020 revealed a hematocrit of 40.6, hemoglobin 13.4, platelets 276,000, WBC 7,700. Ferritin was 64. Vitamin B12 was 254 and folate 8.2.  Mammogram is scheduled for 10/17/2020.  During the interim, she has been feeling very weak. This is the first time she has gone 6 months between IV iron. Around the 5 months, she started feeling weak. On 09/20/2020, she had to leave work because she felt that she was going to pass out. The patient starts to feel symptomatic when her ferritin is <100.  The patient was receiving vitamin B12 injections but has not had one in months. She would like to start getting injections again.  The patient had an ablation 15 years ago and is not having any vaginal bleeding.   Past Medical History:  Diagnosis Date  . Cancer ([place])    skin  . Fibromyalgia   . Low iron     Past Surgical History:  Procedure Laterality Date  . BREAST BIOPSY Left    core- neg  . CESAREAN SECTION    . ENDOMETRIAL ABLATION      Family History  Problem Relation Age of Onset  . Cancer Mother   . Cancer Maternal Grandfather   . Breast cancer Neg Hx     Social History:  reports that she has never smoked. She has never used smokeless tobacco. She reports that she does not drink alcohol and does not use drugs. She has 2 children (daughter 59, son 41). 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 She lives in [place].  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  Allergies: No Known Allergies  Current Medications: Current Outpatient Medications  Medication Sig Dispense Refill  . Cyanocobalamin 1000 MCG/ML KIT Inject 1,000 mcg as directed.    . IRON SUCROSE IV Inject into the vein every 30 (thirty) days.    Marland Kitchen sertraline (ZOLOFT) 100 MG tablet Take 100 mg by mouth daily.     . fluticasone (FLONASE) 50 MCG/ACT nasal spray Place into the nose. (Patient not taking: No sig reported)    . loratadine (CLARITIN) 10 MG tablet Take by mouth. (Patient not taking: No sig reported)     No current facility-administered medications for this visit.   Facility-Administered Medications Ordered in Other Visits  Medication Dose Route Frequency Provider Last Rate Last Admin  . sodium chloride flush (NS) 0.9 % injection 10 mL  10 mL Intracatheter Once PRN Karen Kitchens, NP      . sodium chloride flush (NS) 0.9 % injection 3 mL  3 mL Intracatheter Once PRN Karen Kitchens, NP        Review of Systems  Constitutional: Positive for malaise/fatigue and weight loss (6 lbs since 01/2020). Negative for chills, diaphoresis and fever.       Feels "weak."  HENT: Negative.  Negative for congestion, ear discharge, ear pain, hearing loss, nosebleeds, sinus pain, sore  throat and tinnitus.   Eyes: Negative.  Negative for blurred vision.  Respiratory: Negative.  Negative for cough, hemoptysis, sputum production and shortness of breath.   Cardiovascular: Negative.  Negative for chest pain, palpitations and leg swelling.  Gastrointestinal: Negative.  Negative for abdominal pain, blood in stool, constipation, diarrhea, heartburn, melena, nausea and vomiting.  Genitourinary: Negative.  Negative for dysuria, frequency, hematuria and urgency.       No periods.  Musculoskeletal: Negative.  Negative for back pain, joint pain, myalgias and neck pain.  Skin: Negative.  Negative for itching and rash.  Neurological:  Negative.  Negative for dizziness, tremors, sensory change, speech change, focal weakness, weakness and headaches.  Endo/Heme/Allergies: Negative.   Psychiatric/Behavioral: Negative.  Negative for depression and memory loss. The patient is not nervous/anxious and does not have insomnia.   All other systems reviewed and are negative.  Performance status (ECOG): 1  Vitals Blood pressure 123/88, pulse 83, temperature (!) 96.2 F (35.7 C), temperature source Tympanic, weight 148 lb 11.2 oz (67.4 kg), SpO2 100 %.   Physical Exam Vitals and nursing note reviewed.  Constitutional:      General: She is not in acute distress.    Appearance: She is well-developed. She is not diaphoretic.  HENT:     Head: Normocephalic and atraumatic.     Comments: Long brown hair.    Mouth/Throat:     Mouth: Mucous membranes are moist.     Pharynx: Oropharynx is clear.  Eyes:     General: No scleral icterus.    Extraocular Movements: Extraocular movements intact.     Conjunctiva/sclera: Conjunctivae normal.     Pupils: Pupils are equal, round, and reactive to light.     Comments: Blue eyes.   Cardiovascular:     Rate and Rhythm: Normal rate and regular rhythm.     Heart sounds: Normal heart sounds. No murmur heard.   Pulmonary:     Effort: Pulmonary effort is normal. No respiratory distress.     Breath sounds: Normal breath sounds. No wheezing or rales.  Chest:     Chest wall: No tenderness.  Breasts:     Right: No axillary adenopathy or supraclavicular adenopathy.     Left: No axillary adenopathy or supraclavicular adenopathy.    Abdominal:     General: Bowel sounds are normal. There is no distension.     Palpations: Abdomen is soft. There is no mass.     Tenderness: There is no abdominal tenderness. There is no guarding or rebound.  Musculoskeletal:        General: No swelling or tenderness. Normal range of motion.     Cervical back: Normal range of motion and neck supple.  Lymphadenopathy:      Head:     Right side of head: No preauricular, posterior auricular or occipital adenopathy.     Left side of head: No preauricular, posterior auricular or occipital adenopathy.     Cervical: No cervical adenopathy.     Upper Body:     Right upper body: No supraclavicular or axillary adenopathy.     Left upper body: No supraclavicular or axillary adenopathy.     Lower Body: No right inguinal adenopathy. No left inguinal adenopathy.  Skin:    General: Skin is warm and dry.  Neurological:     Mental Status: She is alert and oriented to person, place, and time.  Psychiatric:        Behavior: Behavior normal.  Thought Content: Thought content normal.        Judgment: Judgment normal.    No visits with results within 3 Day(s) from this visit.  Latest known visit with results is:  Appointment on 09/20/2020  Component Date Value Ref Range Status  . Folate 09/20/2020 8.2  >5.9 ng/mL Final   Performed at [facility], [address] [place], [place] [address]  . Vitamin B-12 09/20/2020 254  180 - 914 pg/mL Final   Comment: (NOTE) This assay is not validated for testing neonatal or myeloproliferative syndrome specimens for Vitamin B12 levels. Performed at [facility], [place] [address], [place], [place] [address]   . Ferritin 09/20/2020 64  11 - 307 ng/mL Final   Performed at [facility], [address] [place], [place] [address]  . WBC 09/20/2020 7.7  4.0 - 10.5 K/uL Final  . RBC 09/20/2020 4.63  3.87 - 5.11 MIL/uL Final  . Hemoglobin 09/20/2020 13.4  12.0 - 15.0 g/dL Final  . HCT 09/20/2020 40.6  36.0 - 46.0 % Final  . MCV 09/20/2020 87.7  80.0 - 100.0 fL Final  . MCH 09/20/2020 28.9  26.0 - 34.0 pg Final  . MCHC 09/20/2020 33.0  30.0 - 36.0 g/dL Final  . RDW 09/20/2020 12.7  11.5 - 15.5 % Final  . Platelets 09/20/2020 276  150 - 400 K/uL Final  . nRBC 09/20/2020 0.0  0.0 - 0.2 % Final  . Neutrophils Relative % 09/20/2020 70  % Final  . Neutro  Abs 09/20/2020 5.4  1.7 - 7.7 K/uL Final  . Lymphocytes Relative 09/20/2020 23  % Final  . Lymphs Abs 09/20/2020 1.8  0.7 - 4.0 K/uL Final  . Monocytes Relative 09/20/2020 5  % Final  . Monocytes Absolute 09/20/2020 0.4  0.1 - 1.0 K/uL Final  . Eosinophils Relative 09/20/2020 1  % Final  . Eosinophils Absolute 09/20/2020 0.1  0.0 - 0.5 K/uL Final  . Basophils Relative 09/20/2020 0  % Final  . Basophils Absolute 09/20/2020 0.0  0.0 - 0.1 K/uL Final  . Immature Granulocytes 09/20/2020 1  % Final  . Abs Immature Granulocytes 09/20/2020 0.06  0.00 - 0.07 K/uL Final   Performed at [facility], [address] [place], [place] [address]    Assessment:  EVANY SCHECTER is a 45 y.o. female with a long standing history ofiron deficiency anemia. Dietis modest. She is unable to tolerate oral iron. She has chronic diarrhea with some dark stools.  Work-up on 04/30/2018 revealed a hematocrit 23.9, hemoglobin 7.0, and MCV 64.5. Ferritin was 2. Iron saturation was 2% with a TIBC of 499. Reticulocyte count was 2.5%. B12 was 184. Normal studies included: TSH, ANA, and folate (13.5). Urinalysis on 05/07/2018 revealed no hematuria.   She has received IV ironin the past. She received 1 unit of PRBCson 05/01/2018. She received Venoferweekly x 4 (04/30/2018 - 05/21/2018),x 2 (06/12/2018 and 06/23/2018), x4 (12/22/2018 - 01/12/2019), 07/15/2019, x 3 (09/15/2019 - 09/30/2019), and x 2 (01/05/2020 - 01/12/2020).  Ferritinhas been followed: 4 on 08/14/2011, 133 on 10/24/2011, 125 on 12/17/2011, 18 on 10/08/2012, 3 on 04/24/2018, 2 on 04/30/2018, 52 on 06/11/2018, 63 on 08/11/2018, 33 on 11/16/2018, 19 on 12/15/2018, 80 on 02/16/2019, 46 on 03/31/2019, 33 on 05/21/2019, 21 on 07/07/2019, 13 on 09/10/2019, 99 on 11/10/2019, 57 on 01/04/2020, 102 on 04/11/2020, 74 on 07/06/2020, 64 on 09/20/2020, and 61 on 10/04/2020.  She receives Venofer when ferritin < 100.  EGD  on 05/11/2018  revealed erythematous mucosa in the antrum (minmal chronic gastritis and mild foveolar hyperplasia) and a 1 cm hiatal hernia. Colonoscopyon 05/11/2018 was normal. Random biopsies revealed no abnormalities.Capsule enteroscopyon 06/15/2018 was normal.  Urinalysis on 05/17/2018 and 12/15/2018 revealed no hematuria.  She has B12 deficiency. Intrinsic factor and anti-parietal cell antibody were normal on 06/12/2018. She began oral B12 on 05/01/2018. She began B12 injections on 06/12/2018 (last 01/05/2020).B12 was 184 on 04/30/2018,316 on 06/11/2018, 380 on 09/22/2018, 448 on 01/05/2020, and 254 on 09/20/2020.Folate was 9.2 on 01/05/2020.  Symptomatically, she feels very weak.  She feels weak when her ferritin is < 100.  She has not had a B12 injection in months. She denies any bleeding.  Exam is stable.  Plan: 1.   Review labs from 09/20/2020 2.Iron deficiency anemia Hematocrit 41.0.  Hemoglobin13.6. MCV  88.0 today.  Ferritin64. PriorEGD revealed mild gastritis. Colonoscopy and capsule study were normal.. She denies any menses. She would like to receive Venofer if her ferritin is < 100. 3. B12 deficiency B12 was 254 on 09/20/2020.  B12 goal is 400.  Discuss plan to reinitiate B12 injections monthly. 4.   B12 today and monthly x 6. 5.   Venofer today with urine pregnancy test. 6.   RTC for addition IV iron based on today's results. 7.   RTC in 2 months for labs (CBC, ferritin) and B12. 8.   RTC in 4 months for MD assessment, labs (CBC with diff, ferritin- day before) and B12 +/- Venofer.  I discussed the assessment and treatment plan with the patient.  The patient was provided an opportunity to ask questions and all were answered.  The patient agreed with the plan and demonstrated an understanding of the instructions.  The patient was advised to call back if the symptoms worsen or if the  condition fails to improve as anticipated.   Lequita Asal, MD, PhD    10/04/2020, 10:24 AM   I, Mirian Mo Tufford, am acting as a Education administrator for Lequita Asal, MD.  I, [place] Mike Gip, MD, have reviewed the above documentation for accuracy and completeness, and I agree with the above.

## 2020-10-04 ENCOUNTER — Inpatient Hospital Stay: Payer: 59 | Attending: Hematology and Oncology | Admitting: Hematology and Oncology

## 2020-10-04 ENCOUNTER — Inpatient Hospital Stay: Payer: 59

## 2020-10-04 ENCOUNTER — Other Ambulatory Visit: Payer: Self-pay

## 2020-10-04 ENCOUNTER — Encounter: Payer: Self-pay | Admitting: Hematology and Oncology

## 2020-10-04 ENCOUNTER — Other Ambulatory Visit: Payer: 59

## 2020-10-04 VITALS — BP 123/88 | HR 83 | Temp 96.2°F | Wt 148.7 lb

## 2020-10-04 DIAGNOSIS — E538 Deficiency of other specified B group vitamins: Secondary | ICD-10-CM | POA: Insufficient documentation

## 2020-10-04 DIAGNOSIS — D509 Iron deficiency anemia, unspecified: Secondary | ICD-10-CM

## 2020-10-04 DIAGNOSIS — D500 Iron deficiency anemia secondary to blood loss (chronic): Secondary | ICD-10-CM

## 2020-10-04 LAB — CBC
HCT: 41 % (ref 36.0–46.0)
Hemoglobin: 13.6 g/dL (ref 12.0–15.0)
MCH: 29.2 pg (ref 26.0–34.0)
MCHC: 33.2 g/dL (ref 30.0–36.0)
MCV: 88 fL (ref 80.0–100.0)
Platelets: 282 10*3/uL (ref 150–400)
RBC: 4.66 MIL/uL (ref 3.87–5.11)
RDW: 12.9 % (ref 11.5–15.5)
WBC: 5.2 10*3/uL (ref 4.0–10.5)
nRBC: 0 % (ref 0.0–0.2)

## 2020-10-04 LAB — PREGNANCY, URINE: Preg Test, Ur: NEGATIVE

## 2020-10-04 LAB — FERRITIN: Ferritin: 61 ng/mL (ref 11–307)

## 2020-10-04 MED ORDER — IRON SUCROSE 20 MG/ML IV SOLN
200.0000 mg | Freq: Once | INTRAVENOUS | Status: AC
  Administered 2020-10-04: 200 mg via INTRAVENOUS
  Filled 2020-10-04: qty 10

## 2020-10-04 MED ORDER — SODIUM CHLORIDE 0.9 % IV SOLN
Freq: Once | INTRAVENOUS | Status: AC
  Filled 2020-10-04: qty 250

## 2020-10-04 MED ORDER — CYANOCOBALAMIN 1000 MCG/ML IJ SOLN
1000.0000 ug | Freq: Once | INTRAMUSCULAR | Status: AC
  Administered 2020-10-04: 1000 ug via INTRAMUSCULAR
  Filled 2020-10-04: qty 1

## 2020-10-17 ENCOUNTER — Other Ambulatory Visit: Payer: Self-pay

## 2020-10-17 ENCOUNTER — Ambulatory Visit
Admission: RE | Admit: 2020-10-17 | Discharge: 2020-10-17 | Disposition: A | Discharge status: Home / Self Care | Payer: 59 | Source: Ambulatory Visit | Attending: Obstetrics and Gynecology | Admitting: Obstetrics and Gynecology

## 2020-10-17 DIAGNOSIS — Z1231 Encounter for screening mammogram for malignant neoplasm of breast: Secondary | ICD-10-CM | POA: Diagnosis present

## 2020-10-20 ENCOUNTER — Other Ambulatory Visit: Payer: Self-pay | Admitting: Obstetrics and Gynecology

## 2020-10-20 DIAGNOSIS — R928 Other abnormal and inconclusive findings on diagnostic imaging of breast: Secondary | ICD-10-CM

## 2020-10-20 DIAGNOSIS — N6310 Unspecified lump in the right breast, unspecified quadrant: Secondary | ICD-10-CM

## 2020-11-02 ENCOUNTER — Other Ambulatory Visit: Payer: Self-pay

## 2020-11-02 ENCOUNTER — Ambulatory Visit
Admission: RE | Admit: 2020-11-02 | Discharge: 2020-11-02 | Disposition: A | Discharge status: Home / Self Care | Payer: 59 | Source: Ambulatory Visit | Attending: Obstetrics and Gynecology | Admitting: Obstetrics and Gynecology

## 2020-11-02 DIAGNOSIS — R928 Other abnormal and inconclusive findings on diagnostic imaging of breast: Secondary | ICD-10-CM | POA: Insufficient documentation

## 2020-11-02 DIAGNOSIS — N6310 Unspecified lump in the right breast, unspecified quadrant: Secondary | ICD-10-CM | POA: Insufficient documentation

## 2020-11-07 ENCOUNTER — Other Ambulatory Visit: Payer: Self-pay

## 2020-11-07 ENCOUNTER — Inpatient Hospital Stay: Payer: 59 | Attending: Hematology and Oncology

## 2020-11-07 DIAGNOSIS — E538 Deficiency of other specified B group vitamins: Secondary | ICD-10-CM | POA: Diagnosis present

## 2020-11-07 DIAGNOSIS — D500 Iron deficiency anemia secondary to blood loss (chronic): Secondary | ICD-10-CM

## 2020-11-07 MED ORDER — CYANOCOBALAMIN 1000 MCG/ML IJ SOLN
1000.0000 ug | Freq: Once | INTRAMUSCULAR | Status: AC
  Administered 2020-11-07: 1000 ug via INTRAMUSCULAR
  Filled 2020-11-07: qty 1

## 2020-12-04 ENCOUNTER — Other Ambulatory Visit: Payer: Self-pay

## 2020-12-04 DIAGNOSIS — D500 Iron deficiency anemia secondary to blood loss (chronic): Secondary | ICD-10-CM

## 2020-12-04 DIAGNOSIS — E538 Deficiency of other specified B group vitamins: Secondary | ICD-10-CM

## 2020-12-05 ENCOUNTER — Other Ambulatory Visit: Payer: Self-pay

## 2020-12-07 ENCOUNTER — Ambulatory Visit: Payer: 59

## 2020-12-07 ENCOUNTER — Other Ambulatory Visit: Payer: Self-pay

## 2020-12-07 ENCOUNTER — Inpatient Hospital Stay: Payer: 59 | Attending: Nurse Practitioner | Admitting: Oncology

## 2020-12-07 ENCOUNTER — Inpatient Hospital Stay: Payer: 59

## 2020-12-07 ENCOUNTER — Other Ambulatory Visit: Payer: 59

## 2020-12-07 DIAGNOSIS — D509 Iron deficiency anemia, unspecified: Secondary | ICD-10-CM | POA: Diagnosis not present

## 2020-12-07 DIAGNOSIS — E538 Deficiency of other specified B group vitamins: Secondary | ICD-10-CM | POA: Insufficient documentation

## 2020-12-07 DIAGNOSIS — D500 Iron deficiency anemia secondary to blood loss (chronic): Secondary | ICD-10-CM

## 2020-12-07 LAB — CBC WITH DIFFERENTIAL/PLATELET
Abs Immature Granulocytes: 0.06 10*3/uL (ref 0.00–0.07)
Basophils Absolute: 0 10*3/uL (ref 0.0–0.1)
Basophils Relative: 0 %
Eosinophils Absolute: 0.1 10*3/uL (ref 0.0–0.5)
Eosinophils Relative: 2 %
HCT: 37.6 % (ref 36.0–46.0)
Hemoglobin: 12.8 g/dL (ref 12.0–15.0)
Immature Granulocytes: 1 %
Lymphocytes Relative: 22 %
Lymphs Abs: 1.8 10*3/uL (ref 0.7–4.0)
MCH: 29.7 pg (ref 26.0–34.0)
MCHC: 34 g/dL (ref 30.0–36.0)
MCV: 87.2 fL (ref 80.0–100.0)
Monocytes Absolute: 0.7 10*3/uL (ref 0.1–1.0)
Monocytes Relative: 8 %
Neutro Abs: 5.5 10*3/uL (ref 1.7–7.7)
Neutrophils Relative %: 67 %
Platelets: 279 10*3/uL (ref 150–400)
RBC: 4.31 MIL/uL (ref 3.87–5.11)
RDW: 12.6 % (ref 11.5–15.5)
WBC: 8.1 10*3/uL (ref 4.0–10.5)
nRBC: 0 % (ref 0.0–0.2)

## 2020-12-07 LAB — FERRITIN: Ferritin: 65 ng/mL (ref 11–307)

## 2020-12-07 MED ORDER — CYANOCOBALAMIN 1000 MCG/ML IJ SOLN
1000.0000 ug | Freq: Once | INTRAMUSCULAR | Status: AC
  Administered 2020-12-07: 1000 ug via INTRAMUSCULAR
  Filled 2020-12-07: qty 1

## 2020-12-11 NOTE — Progress Notes (Signed)
Per Dr. Kem Parkinson last note, she is to receive IV iron for a ferritin below 100. If she is symptomatic, she can come in for 2 doses of IV venofer.   Faythe Casa, NP 12/11/2020 10:14 AM

## 2020-12-12 ENCOUNTER — Telehealth: Payer: Self-pay

## 2020-12-12 NOTE — Telephone Encounter (Signed)
Second time trying to contact pt to check on her symptoms. If symptomatic, Sonia Baller will like for her to come into clinic to have IV iron.

## 2020-12-29 ENCOUNTER — Encounter: Payer: Self-pay | Admitting: Urgent Care

## 2020-12-29 ENCOUNTER — Other Ambulatory Visit: Payer: Self-pay

## 2020-12-29 DIAGNOSIS — D500 Iron deficiency anemia secondary to blood loss (chronic): Secondary | ICD-10-CM

## 2021-01-02 ENCOUNTER — Inpatient Hospital Stay: Payer: 59

## 2021-01-02 ENCOUNTER — Other Ambulatory Visit: Payer: Self-pay

## 2021-01-02 DIAGNOSIS — D500 Iron deficiency anemia secondary to blood loss (chronic): Secondary | ICD-10-CM

## 2021-01-02 DIAGNOSIS — E538 Deficiency of other specified B group vitamins: Secondary | ICD-10-CM

## 2021-01-02 DIAGNOSIS — D509 Iron deficiency anemia, unspecified: Secondary | ICD-10-CM | POA: Diagnosis not present

## 2021-01-02 LAB — IRON AND TIBC
Iron: 62 ug/dL (ref 28–170)
Saturation Ratios: 16 % (ref 10.4–31.8)
TIBC: 382 ug/dL (ref 250–450)
UIBC: 320 ug/dL

## 2021-01-02 LAB — PREGNANCY, URINE: Preg Test, Ur: NEGATIVE

## 2021-01-03 ENCOUNTER — Inpatient Hospital Stay: Payer: 59 | Attending: Oncology

## 2021-01-03 ENCOUNTER — Ambulatory Visit: Payer: 59

## 2021-01-03 VITALS — BP 124/76 | HR 71 | Temp 97.5°F | Resp 18

## 2021-01-03 DIAGNOSIS — D500 Iron deficiency anemia secondary to blood loss (chronic): Secondary | ICD-10-CM

## 2021-01-03 DIAGNOSIS — D509 Iron deficiency anemia, unspecified: Secondary | ICD-10-CM | POA: Diagnosis not present

## 2021-01-03 DIAGNOSIS — E538 Deficiency of other specified B group vitamins: Secondary | ICD-10-CM | POA: Insufficient documentation

## 2021-01-03 MED ORDER — SODIUM CHLORIDE 0.9 % IV SOLN
Freq: Once | INTRAVENOUS | Status: AC
  Filled 2021-01-03: qty 250

## 2021-01-03 MED ORDER — IRON SUCROSE 20 MG/ML IV SOLN
200.0000 mg | Freq: Once | INTRAVENOUS | Status: AC
  Administered 2021-01-03: 200 mg via INTRAVENOUS
  Filled 2021-01-03: qty 10

## 2021-01-15 ENCOUNTER — Ambulatory Visit: Payer: 59

## 2021-01-16 ENCOUNTER — Inpatient Hospital Stay: Payer: 59

## 2021-01-16 ENCOUNTER — Other Ambulatory Visit: Payer: Self-pay

## 2021-01-16 ENCOUNTER — Ambulatory Visit: Payer: 59

## 2021-01-16 DIAGNOSIS — D509 Iron deficiency anemia, unspecified: Secondary | ICD-10-CM | POA: Diagnosis not present

## 2021-01-16 DIAGNOSIS — D500 Iron deficiency anemia secondary to blood loss (chronic): Secondary | ICD-10-CM

## 2021-01-16 MED ORDER — CYANOCOBALAMIN 1000 MCG/ML IJ SOLN
1000.0000 ug | Freq: Once | INTRAMUSCULAR | Status: AC
  Administered 2021-01-16: 1000 ug via INTRAMUSCULAR

## 2021-02-06 ENCOUNTER — Other Ambulatory Visit: Payer: Self-pay

## 2021-02-06 ENCOUNTER — Inpatient Hospital Stay: Payer: 59 | Attending: Internal Medicine | Admitting: Oncology

## 2021-02-06 DIAGNOSIS — D500 Iron deficiency anemia secondary to blood loss (chronic): Secondary | ICD-10-CM | POA: Insufficient documentation

## 2021-02-06 DIAGNOSIS — E538 Deficiency of other specified B group vitamins: Secondary | ICD-10-CM | POA: Insufficient documentation

## 2021-02-06 LAB — CBC WITH DIFFERENTIAL/PLATELET
Abs Immature Granulocytes: 0.05 10*3/uL (ref 0.00–0.07)
Basophils Absolute: 0 10*3/uL (ref 0.0–0.1)
Basophils Relative: 1 %
Eosinophils Absolute: 0.2 10*3/uL (ref 0.0–0.5)
Eosinophils Relative: 4 %
HCT: 37.2 % (ref 36.0–46.0)
Hemoglobin: 12.5 g/dL (ref 12.0–15.0)
Immature Granulocytes: 1 %
Lymphocytes Relative: 23 %
Lymphs Abs: 1.5 10*3/uL (ref 0.7–4.0)
MCH: 29.5 pg (ref 26.0–34.0)
MCHC: 33.6 g/dL (ref 30.0–36.0)
MCV: 87.7 fL (ref 80.0–100.0)
Monocytes Absolute: 0.5 10*3/uL (ref 0.1–1.0)
Monocytes Relative: 8 %
Neutro Abs: 4 10*3/uL (ref 1.7–7.7)
Neutrophils Relative %: 63 %
Platelets: 263 10*3/uL (ref 150–400)
RBC: 4.24 MIL/uL (ref 3.87–5.11)
RDW: 12.9 % (ref 11.5–15.5)
WBC: 6.3 10*3/uL (ref 4.0–10.5)
nRBC: 0 % (ref 0.0–0.2)

## 2021-02-06 LAB — FERRITIN: Ferritin: 90 ng/mL (ref 11–307)

## 2021-02-07 ENCOUNTER — Other Ambulatory Visit: Payer: Self-pay

## 2021-02-07 ENCOUNTER — Inpatient Hospital Stay: Payer: 59 | Admitting: Oncology

## 2021-02-07 ENCOUNTER — Inpatient Hospital Stay: Payer: 59

## 2021-02-07 ENCOUNTER — Encounter: Payer: Self-pay | Admitting: Oncology

## 2021-02-07 VITALS — BP 119/81 | HR 79 | Temp 97.0°F | Resp 18 | Wt 153.1 lb

## 2021-02-07 DIAGNOSIS — D500 Iron deficiency anemia secondary to blood loss (chronic): Secondary | ICD-10-CM

## 2021-02-07 DIAGNOSIS — E538 Deficiency of other specified B group vitamins: Secondary | ICD-10-CM | POA: Diagnosis not present

## 2021-02-07 MED ORDER — CYANOCOBALAMIN 1000 MCG/ML IJ SOLN
1000.0000 ug | Freq: Once | INTRAMUSCULAR | Status: AC
  Administered 2021-02-07: 1000 ug via INTRAMUSCULAR

## 2021-02-07 MED ORDER — CYANOCOBALAMIN 1000 MCG/ML IJ KIT: 1000.0000 ug | PACK | INTRAMUSCULAR | 11 refills | Status: AC

## 2021-02-07 NOTE — Progress Notes (Signed)
Hematology/Oncology Consult note [facility]  Telephone:([phone] [phone]  Patient Care Team: Benjaman Kindler, MD as PCP - General (Obstetrics and Gynecology) Reeves Forth (Gastroenterology) Efrain Sella, MD as Consulting Physician (Gastroenterology)   Name of the patient: Alicia Washington  [id number]  24-Dec-1975   Date of visit: 02/07/21  Diagnosis-history of iron and B12 deficiency  Chief complaint/ Reason for visit-routine follow-up of iron B12 deficiency anemia  Heme/Onc history: Patient is a 45 year old female with history of iron deficiency of unclear etiology.  She has received IV iron in the past.  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  Colonoscopy on 05/11/2018 was normal.  Random biopsies revealed no abnormalities.  Capsule enteroscopy on 06/15/2018 was normal.  Urinalysis on 05/17/2018 and 12/15/2018 revealed no hematuria.   Also has a history of B12 deficiency and currently on B12 injections  Interval history-patient reports feeling well presently except for mild fatigue.  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  She states that whenever her ferritin levels drop she gets significantly fatigued to the point that she cannot perform her ADLs even if she is not significantly anemic.  Her ferritin levels had dropped down to 2 with a hemoglobin of 7 about 2 years ago.  She denies any consistent use of NSAIDs.  Denies any blood loss in her stool or urine.  ECOG PS- 0 Pain scale- 0   Review of systems- Review of Systems  Constitutional:  Positive for malaise/fatigue. Negative for chills, fever and weight loss.  HENT:  Negative for congestion, ear discharge and nosebleeds.   Eyes:  Negative for blurred vision.  Respiratory:   Negative for cough, hemoptysis, sputum production, shortness of breath and wheezing.   Cardiovascular:  Negative for chest pain, palpitations, orthopnea and claudication.  Gastrointestinal:  Negative for abdominal pain, blood in stool, constipation, diarrhea, heartburn, melena, nausea and vomiting.  Genitourinary:  Negative for dysuria, flank pain, frequency, hematuria and urgency.  Musculoskeletal:  Negative for back pain, joint pain and myalgias.  Skin:  Negative for rash.  Neurological:  Negative for dizziness, tingling, focal weakness, seizures, weakness and headaches.  Endo/Heme/Allergies:  Does not bruise/bleed easily.  Psychiatric/Behavioral:  Negative for depression and suicidal ideas. The patient does not have insomnia.      No Known Allergies   Past Medical History:  Diagnosis Date   Cancer ([place])    skin   Fibromyalgia    Low iron      Past Surgical History:  Procedure Laterality Date   BREAST BIOPSY Left    core- neg   CESAREAN SECTION     ENDOMETRIAL ABLATION      Social History   Socioeconomic History   Marital status: Married    Spouse name: Not on file   Number of children: Not on file   Years of education: Not on file   Highest education level: Not on file  Occupational History   Not on file  Tobacco Use   Smoking status: Never   Smokeless tobacco: Never  Vaping Use   Vaping Use: Never used  Substance and Sexual Activity   Alcohol use: No   Drug use: Never   Sexual activity: Not on file  Other Topics Concern   Not on file  Social History Narrative   Not on file   Social Determinants of Health   Financial Resource Strain: Not on file  Food Insecurity: Not on file  Transportation Needs: Not on file  Physical Activity: Not on file  Stress: Not on file  Social Connections: Not on file  Intimate Partner Violence: Not on file    Family History  Problem Relation Age of Onset   Cancer Mother    Cancer Maternal Grandfather    Breast cancer  Neg Hx      Current Outpatient Medications:    Cyanocobalamin 1000 MCG/ML KIT, Inject 1,000 mcg as directed., Disp: , Rfl:    IRON SUCROSE IV, Inject into the vein every 30 (thirty) days., Disp: , Rfl:    sertraline (ZOLOFT) 100 MG tablet, Take 100 mg by mouth daily. , Disp: , Rfl:    sertraline (ZOLOFT) 100 MG tablet, Take 1 tablet by mouth daily., Disp: , Rfl:    fluticasone (FLONASE) 50 MCG/ACT nasal spray, Place into the nose. (Patient not taking: No sig reported), Disp: , Rfl:    loratadine (CLARITIN) 10 MG tablet, Take by mouth. (Patient not taking: No sig reported), Disp: , Rfl:  No current facility-administered medications for this visit.  Facility-Administered Medications Ordered in Other Visits:    sodium chloride flush (NS) 0.9 % injection 10 mL, 10 mL, Intracatheter, Once PRN, Karen Kitchens, NP   sodium chloride flush (NS) 0.9 % injection 3 mL, 3 mL, Intracatheter, Once PRN, Karen Kitchens, NP  Physical exam:  Vitals:   02/07/21 1341  Weight: 153 lb 1.8 oz (69.4 kg)   Physical Exam Constitutional:      General: She is not in acute distress. Cardiovascular:     Rate and Rhythm: Normal rate and regular rhythm.     Heart sounds: Normal heart sounds.  Pulmonary:     Effort: Pulmonary effort is normal.     Breath sounds: Normal breath sounds.  Abdominal:     General: Bowel sounds are normal.     Palpations: Abdomen is soft.  Skin:    General: Skin is warm and dry.  Neurological:     Mental Status: She is alert and oriented to person, place, and time.     CMP Latest Ref Rng & Units 04/30/2018  Glucose 70 - 99 mg/dL 104(H)  BUN 6 - 20 mg/dL 11  Creatinine 0.44 - 1.00 mg/dL 0.73  Sodium 135 - 145 mmol/L 136  Potassium 3.5 - 5.1 mmol/L 3.9  Chloride 98 - 111 mmol/L 108  CO2 22 - 32 mmol/L 22  Calcium 8.9 - 10.3 mg/dL 9.0  Total Protein 6.5 - 8.1 g/dL 7.8  Total Bilirubin 0.3 - 1.2 mg/dL 0.3  Alkaline Phos 38 - 126 U/L 52  AST 15 - 41 U/L 12(L)  ALT 0 - 44 U/L 11    CBC Latest Ref Rng & Units 02/06/2021  WBC 4.0 - 10.5 K/uL 6.3  Hemoglobin 12.0 - 15.0 g/dL 12.5  Hematocrit 36.0 - 46.0 % 37.2  Platelets 150 - 400 K/uL 263     Assessment and plan- Patient is a 45 y.o. female with history of iron and B12 deficiency anemia here for routine follow-up  Iron deficiency anemia: Patient is currently not anemic and her hemoglobin is 12.5.  Ferritin levels are normal at 90.  Patient would like ferritin to be close to 200 to prevent symptoms of overwhelming fatigue.  Patient got 1 dose of IV iron in June 2022.  At this time I would recommend checking CBC ferritin and iron studies in 2 4 and 6 months.  If ferritin levels are less than 50 or iron saturation less than 20% I will consider giving her full 5 doses of Venofer instead of doing 1 dose which is inadequate supplementation.  Patient has previously had an entire GI work-up but did not reveal any evidence of GI bleed.  She is also s/p endometrial ablation and therefore does not get any menstrual cycles   Visit Diagnosis 1. Iron deficiency anemia due to chronic blood loss   2. B12 deficiency      Dr. Randa Evens, MD, MPH [facility] at [facility] [phone] 02/07/2021 1:46 PM

## 2021-02-08 ENCOUNTER — Encounter: Payer: Self-pay | Admitting: Urgent Care

## 2021-02-08 NOTE — Progress Notes (Signed)
Labs are stable for this patient.   No iron needed.   Faythe Casa, NP 02/08/2021 11:24 AM

## 2021-02-19 ENCOUNTER — Encounter: Payer: Self-pay | Admitting: Urgent Care

## 2021-04-11 ENCOUNTER — Other Ambulatory Visit: Payer: Self-pay

## 2021-04-11 ENCOUNTER — Inpatient Hospital Stay: Payer: 59 | Attending: Oncology

## 2021-04-11 DIAGNOSIS — E538 Deficiency of other specified B group vitamins: Secondary | ICD-10-CM | POA: Diagnosis not present

## 2021-04-11 DIAGNOSIS — D509 Iron deficiency anemia, unspecified: Secondary | ICD-10-CM | POA: Diagnosis present

## 2021-04-11 DIAGNOSIS — D500 Iron deficiency anemia secondary to blood loss (chronic): Secondary | ICD-10-CM

## 2021-04-11 LAB — CBC WITH DIFFERENTIAL/PLATELET
Abs Immature Granulocytes: 0.07 10*3/uL (ref 0.00–0.07)
Basophils Absolute: 0 10*3/uL (ref 0.0–0.1)
Basophils Relative: 1 %
Eosinophils Absolute: 0.1 10*3/uL (ref 0.0–0.5)
Eosinophils Relative: 2 %
HCT: 41.1 % (ref 36.0–46.0)
Hemoglobin: 13.6 g/dL (ref 12.0–15.0)
Immature Granulocytes: 1 %
Lymphocytes Relative: 27 %
Lymphs Abs: 1.8 10*3/uL (ref 0.7–4.0)
MCH: 29.3 pg (ref 26.0–34.0)
MCHC: 33.1 g/dL (ref 30.0–36.0)
MCV: 88.6 fL (ref 80.0–100.0)
Monocytes Absolute: 0.6 10*3/uL (ref 0.1–1.0)
Monocytes Relative: 10 %
Neutro Abs: 3.9 10*3/uL (ref 1.7–7.7)
Neutrophils Relative %: 59 %
Platelets: 304 10*3/uL (ref 150–400)
RBC: 4.64 MIL/uL (ref 3.87–5.11)
RDW: 12.6 % (ref 11.5–15.5)
WBC: 6.6 10*3/uL (ref 4.0–10.5)
nRBC: 0 % (ref 0.0–0.2)

## 2021-04-11 LAB — FERRITIN: Ferritin: 51 ng/mL (ref 11–307)

## 2021-04-11 LAB — IRON AND TIBC
Iron: 53 ug/dL (ref 28–170)
Saturation Ratios: 13 % (ref 10.4–31.8)
TIBC: 402 ug/dL (ref 250–450)
UIBC: 349 ug/dL

## 2021-04-11 MED ORDER — "SYRINGE 25G X 1"" 3 ML MISC": 3.0000 mL | 0 refills | Status: AC

## 2021-04-16 ENCOUNTER — Telehealth: Payer: Self-pay | Admitting: *Deleted

## 2021-04-16 NOTE — Telephone Encounter (Signed)
Patient called stating that she needs to set up an iron infusion appointment because her lab check last week shows Ferritin of 50 and her target is 100. Please advise

## 2021-04-16 NOTE — Telephone Encounter (Signed)
Called patient got her voicemail earlier that Dr. Janese Banks said since her ferritin is at 51 and she can get 5 doses of Venofer.  I checked with the authorization team for insurance and Anderson [place] is what they believe.  I left patient a message that I will send a message to our scheduler and she will be able to set patient up for 5 visits and then call her with the dates.

## 2021-04-17 ENCOUNTER — Telehealth: Payer: Self-pay | Admitting: Oncology

## 2021-04-17 NOTE — Telephone Encounter (Signed)
Left Vm with patient to let her know that her insurance has approved her for Venofer. Infusions have been scheduled--gave her my direct extension to call back and confirm.

## 2021-04-20 ENCOUNTER — Inpatient Hospital Stay: Payer: 59

## 2021-04-20 ENCOUNTER — Other Ambulatory Visit: Payer: Self-pay

## 2021-04-20 VITALS — BP 124/80 | HR 76 | Temp 97.0°F | Resp 18

## 2021-04-20 DIAGNOSIS — D500 Iron deficiency anemia secondary to blood loss (chronic): Secondary | ICD-10-CM

## 2021-04-20 DIAGNOSIS — D509 Iron deficiency anemia, unspecified: Secondary | ICD-10-CM | POA: Diagnosis not present

## 2021-04-20 MED ORDER — IRON SUCROSE 20 MG/ML IV SOLN
200.0000 mg | Freq: Once | INTRAVENOUS | Status: AC
  Administered 2021-04-20: 200 mg via INTRAVENOUS
  Filled 2021-04-20: qty 10

## 2021-04-20 MED ORDER — CYANOCOBALAMIN 1000 MCG/ML IJ SOLN
1000.0000 ug | Freq: Once | INTRAMUSCULAR | Status: AC
  Administered 2021-04-20: 1000 ug via INTRAMUSCULAR
  Filled 2021-04-20: qty 1

## 2021-04-20 MED ORDER — SODIUM CHLORIDE 0.9 % IV SOLN
Freq: Once | INTRAVENOUS | Status: AC
  Filled 2021-04-20: qty 250

## 2021-04-25 ENCOUNTER — Inpatient Hospital Stay: Payer: 59

## 2021-04-25 ENCOUNTER — Other Ambulatory Visit: Payer: Self-pay

## 2021-04-25 VITALS — BP 129/84 | HR 80 | Temp 97.0°F | Resp 18

## 2021-04-25 DIAGNOSIS — D500 Iron deficiency anemia secondary to blood loss (chronic): Secondary | ICD-10-CM

## 2021-04-25 DIAGNOSIS — D509 Iron deficiency anemia, unspecified: Secondary | ICD-10-CM | POA: Diagnosis not present

## 2021-04-25 MED ORDER — SODIUM CHLORIDE 0.9 % IV SOLN
Freq: Once | INTRAVENOUS | Status: AC
  Filled 2021-04-25: qty 250

## 2021-04-25 MED ORDER — IRON SUCROSE 20 MG/ML IV SOLN
200.0000 mg | Freq: Once | INTRAVENOUS | Status: AC
  Administered 2021-04-25: 200 mg via INTRAVENOUS
  Filled 2021-04-25: qty 10

## 2021-04-27 ENCOUNTER — Other Ambulatory Visit: Payer: Self-pay

## 2021-04-27 ENCOUNTER — Inpatient Hospital Stay: Payer: 59

## 2021-04-27 VITALS — BP 121/84 | HR 82 | Temp 97.0°F | Resp 18

## 2021-04-27 DIAGNOSIS — D509 Iron deficiency anemia, unspecified: Secondary | ICD-10-CM | POA: Diagnosis not present

## 2021-04-27 DIAGNOSIS — D500 Iron deficiency anemia secondary to blood loss (chronic): Secondary | ICD-10-CM

## 2021-04-27 MED ORDER — IRON SUCROSE 20 MG/ML IV SOLN
200.0000 mg | Freq: Once | INTRAVENOUS | Status: AC
  Administered 2021-04-27: 200 mg via INTRAVENOUS

## 2021-04-27 MED ORDER — SODIUM CHLORIDE 0.9 % IV SOLN
Freq: Once | INTRAVENOUS | Status: AC
  Filled 2021-04-27: qty 250

## 2021-05-02 ENCOUNTER — Other Ambulatory Visit: Payer: Self-pay

## 2021-05-02 ENCOUNTER — Inpatient Hospital Stay: Payer: 59

## 2021-05-02 VITALS — BP 128/85 | HR 88 | Temp 97.0°F | Resp 18

## 2021-05-02 DIAGNOSIS — D509 Iron deficiency anemia, unspecified: Secondary | ICD-10-CM | POA: Diagnosis not present

## 2021-05-02 DIAGNOSIS — D500 Iron deficiency anemia secondary to blood loss (chronic): Secondary | ICD-10-CM

## 2021-05-02 MED ORDER — IRON SUCROSE 20 MG/ML IV SOLN
200.0000 mg | Freq: Once | INTRAVENOUS | Status: AC
  Administered 2021-05-02: 200 mg via INTRAVENOUS
  Filled 2021-05-02: qty 10

## 2021-05-02 MED ORDER — SODIUM CHLORIDE 0.9 % IV SOLN
Freq: Once | INTRAVENOUS | Status: AC
  Filled 2021-05-02: qty 250

## 2021-05-04 ENCOUNTER — Inpatient Hospital Stay: Payer: 59

## 2021-05-17 ENCOUNTER — Inpatient Hospital Stay: Payer: 59 | Attending: Oncology

## 2021-05-17 ENCOUNTER — Other Ambulatory Visit: Payer: Self-pay

## 2021-05-17 VITALS — BP 106/62 | HR 88 | Temp 98.0°F | Resp 18

## 2021-05-17 DIAGNOSIS — E538 Deficiency of other specified B group vitamins: Secondary | ICD-10-CM | POA: Insufficient documentation

## 2021-05-17 DIAGNOSIS — D509 Iron deficiency anemia, unspecified: Secondary | ICD-10-CM | POA: Insufficient documentation

## 2021-05-17 DIAGNOSIS — D500 Iron deficiency anemia secondary to blood loss (chronic): Secondary | ICD-10-CM

## 2021-05-17 MED ORDER — SODIUM CHLORIDE 0.9 % IV SOLN
Freq: Once | INTRAVENOUS | Status: AC
  Filled 2021-05-17: qty 250

## 2021-05-17 MED ORDER — IRON SUCROSE 20 MG/ML IV SOLN
200.0000 mg | Freq: Once | INTRAVENOUS | Status: AC
  Administered 2021-05-17: 200 mg via INTRAVENOUS
  Filled 2021-05-17: qty 10

## 2021-05-18 ENCOUNTER — Inpatient Hospital Stay: Payer: 59

## 2021-06-12 ENCOUNTER — Other Ambulatory Visit: Payer: Self-pay

## 2021-06-12 ENCOUNTER — Inpatient Hospital Stay: Payer: 59 | Attending: Oncology

## 2021-06-12 DIAGNOSIS — D500 Iron deficiency anemia secondary to blood loss (chronic): Secondary | ICD-10-CM

## 2021-06-12 DIAGNOSIS — E538 Deficiency of other specified B group vitamins: Secondary | ICD-10-CM | POA: Insufficient documentation

## 2021-06-12 DIAGNOSIS — D509 Iron deficiency anemia, unspecified: Secondary | ICD-10-CM | POA: Insufficient documentation

## 2021-06-12 LAB — CBC WITH DIFFERENTIAL/PLATELET
Abs Immature Granulocytes: 0.04 10*3/uL (ref 0.00–0.07)
Basophils Absolute: 0 10*3/uL (ref 0.0–0.1)
Basophils Relative: 1 %
Eosinophils Absolute: 0.1 10*3/uL (ref 0.0–0.5)
Eosinophils Relative: 2 %
HCT: 40.3 % (ref 36.0–46.0)
Hemoglobin: 13.5 g/dL (ref 12.0–15.0)
Immature Granulocytes: 1 %
Lymphocytes Relative: 27 %
Lymphs Abs: 1.5 10*3/uL (ref 0.7–4.0)
MCH: 29.9 pg (ref 26.0–34.0)
MCHC: 33.5 g/dL (ref 30.0–36.0)
MCV: 89.2 fL (ref 80.0–100.0)
Monocytes Absolute: 0.5 10*3/uL (ref 0.1–1.0)
Monocytes Relative: 9 %
Neutro Abs: 3.3 10*3/uL (ref 1.7–7.7)
Neutrophils Relative %: 60 %
Platelets: 263 10*3/uL (ref 150–400)
RBC: 4.52 MIL/uL (ref 3.87–5.11)
RDW: 13 % (ref 11.5–15.5)
WBC: 5.4 10*3/uL (ref 4.0–10.5)
nRBC: 0 % (ref 0.0–0.2)

## 2021-06-12 LAB — IRON AND TIBC
Iron: 123 ug/dL (ref 28–170)
Saturation Ratios: 36 % — ABNORMAL HIGH (ref 10.4–31.8)
TIBC: 339 ug/dL (ref 250–450)
UIBC: 216 ug/dL

## 2021-06-12 LAB — FERRITIN: Ferritin: 451 ng/mL — ABNORMAL HIGH (ref 11–307)

## 2021-08-08 ENCOUNTER — Other Ambulatory Visit: Payer: Self-pay | Admitting: *Deleted

## 2021-08-08 DIAGNOSIS — D500 Iron deficiency anemia secondary to blood loss (chronic): Secondary | ICD-10-CM

## 2021-08-15 ENCOUNTER — Other Ambulatory Visit: Payer: Self-pay

## 2021-08-15 ENCOUNTER — Ambulatory Visit: Payer: 59 | Admitting: Oncology

## 2021-08-15 ENCOUNTER — Other Ambulatory Visit: Payer: 59

## 2021-08-15 ENCOUNTER — Inpatient Hospital Stay: Payer: 59 | Admitting: Oncology

## 2021-08-15 ENCOUNTER — Inpatient Hospital Stay: Payer: 59 | Attending: Oncology

## 2021-08-15 DIAGNOSIS — D500 Iron deficiency anemia secondary to blood loss (chronic): Secondary | ICD-10-CM | POA: Insufficient documentation

## 2021-08-15 DIAGNOSIS — E538 Deficiency of other specified B group vitamins: Secondary | ICD-10-CM | POA: Insufficient documentation

## 2021-08-15 DIAGNOSIS — Z79899 Other long term (current) drug therapy: Secondary | ICD-10-CM | POA: Diagnosis not present

## 2021-08-15 LAB — CBC WITH DIFFERENTIAL/PLATELET
Abs Immature Granulocytes: 0.03 10*3/uL (ref 0.00–0.07)
Basophils Absolute: 0 10*3/uL (ref 0.0–0.1)
Basophils Relative: 1 %
Eosinophils Absolute: 0.1 10*3/uL (ref 0.0–0.5)
Eosinophils Relative: 2 %
HCT: 38.3 % (ref 36.0–46.0)
Hemoglobin: 12.7 g/dL (ref 12.0–15.0)
Immature Granulocytes: 1 %
Lymphocytes Relative: 29 %
Lymphs Abs: 1.4 10*3/uL (ref 0.7–4.0)
MCH: 30.3 pg (ref 26.0–34.0)
MCHC: 33.2 g/dL (ref 30.0–36.0)
MCV: 91.4 fL (ref 80.0–100.0)
Monocytes Absolute: 0.4 10*3/uL (ref 0.1–1.0)
Monocytes Relative: 8 %
Neutro Abs: 2.9 10*3/uL (ref 1.7–7.7)
Neutrophils Relative %: 59 %
Platelets: 244 10*3/uL (ref 150–400)
RBC: 4.19 MIL/uL (ref 3.87–5.11)
RDW: 12.7 % (ref 11.5–15.5)
WBC: 4.8 10*3/uL (ref 4.0–10.5)
nRBC: 0 % (ref 0.0–0.2)

## 2021-08-15 LAB — IRON AND TIBC
Iron: 95 ug/dL (ref 28–170)
Saturation Ratios: 30 % (ref 10.4–31.8)
TIBC: 319 ug/dL (ref 250–450)
UIBC: 224 ug/dL

## 2021-08-15 LAB — FERRITIN: Ferritin: 195 ng/mL (ref 11–307)

## 2021-08-15 LAB — VITAMIN B12: Vitamin B-12: 419 pg/mL (ref 180–914)

## 2021-08-16 ENCOUNTER — Inpatient Hospital Stay (HOSPITAL_BASED_OUTPATIENT_CLINIC_OR_DEPARTMENT_OTHER): Payer: 59 | Admitting: Oncology

## 2021-08-16 ENCOUNTER — Encounter: Payer: Self-pay | Admitting: Oncology

## 2021-08-16 VITALS — BP 132/89 | HR 74 | Temp 98.8°F | Resp 20 | Wt 150.6 lb

## 2021-08-16 DIAGNOSIS — D500 Iron deficiency anemia secondary to blood loss (chronic): Secondary | ICD-10-CM

## 2021-08-16 NOTE — Progress Notes (Signed)
Hematology/Oncology Consult note [facility]  [phone] [phone]  Patient Care Team: Benjaman Kindler, MD as PCP - General (Obstetrics and Gynecology) Reeves Forth (Gastroenterology) Efrain Sella, MD as Consulting Physician (Gastroenterology)   Name of the patient: Alicia Washington  [id number]  1976-03-02   Date of visit: 08/16/21  Diagnosis-history of iron and B12 deficiency  Chief complaint/ Reason for visit-routine follow-up of iron B12 deficiency anemia  Heme/Onc history: Patient is a 46 year old female with history of iron deficiency of unclear etiology.  She has received IV iron in the past.  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  Colonoscopy on 05/11/2018 was normal.  Random biopsies revealed no abnormalities.  Capsule enteroscopy on 06/15/2018 was normal.  Urinalysis on 05/17/2018 and 12/15/2018 revealed no hematuria.   Also has a history of B12 deficiency and currently on B12 injections  Interval history-reports feeling relatively well.  Denies any fatigue at this time.  She is happy that her iron levels appear to be holding steady.  Denies any bleeding or concerns at this time.  ECOG PS- 0 Pain scale- 0   Review of systems- Review of Systems  Constitutional: Negative.  Negative for chills, fever, malaise/fatigue and weight loss.  HENT:  Negative for congestion, ear pain and tinnitus.   Eyes: Negative.  Negative for blurred vision and double vision.  Respiratory: Negative.  Negative for cough, sputum production and shortness of breath.   Cardiovascular: Negative.  Negative for chest pain, palpitations and leg swelling.  Gastrointestinal: Negative.  Negative for abdominal pain, constipation, diarrhea, nausea and vomiting.  Genitourinary:  Negative for  dysuria, frequency and urgency.  Musculoskeletal:  Negative for back pain and falls.  Skin: Negative.  Negative for rash.  Neurological: Negative.  Negative for weakness and headaches.  Endo/Heme/Allergies: Negative.  Does not bruise/bleed easily.  Psychiatric/Behavioral: Negative.  Negative for depression. The patient is not nervous/anxious and does not have insomnia.      No Known Allergies   Past Medical History:  Diagnosis Date   Cancer ([place])    skin   Fibromyalgia    Low iron      Past Surgical History:  Procedure Laterality Date   BREAST BIOPSY Left    core- neg   CESAREAN SECTION     ENDOMETRIAL ABLATION      Social History   Socioeconomic History   Marital status: Married    Spouse name: Not on file   Number of children: Not on file   Years of education: Not on file   Highest education level: Not on file  Occupational History   Not on file  Tobacco Use   Smoking status: Never   Smokeless tobacco: Never  Vaping Use   Vaping Use: Never used  Substance and Sexual Activity   Alcohol use: No   Drug use: Never   Sexual activity: Not on file  Other Topics Concern   Not on file  Social History Narrative   Not on file   Social Determinants of Health   Financial Resource Strain: Not on file  Food Insecurity: Not on file  Transportation Needs: Not on file  Physical Activity: Not on file  Stress: Not on file  Social Connections: Not on file  Intimate Partner Violence: Not on file    Family History  Problem Relation Age  of Onset   Cancer Mother    Cancer Maternal Grandfather    Breast cancer Neg Hx      Current Outpatient Medications:    Cyanocobalamin 1000 MCG/ML KIT, Inject 1,000 mcg as directed every 30 (thirty) days., Disp: 1 kit, Rfl: 11   IRON SUCROSE IV, Inject into the vein every 30 (thirty) days., Disp: , Rfl:    sertraline (ZOLOFT) 100 MG tablet, Take 100 mg by mouth daily. , Disp: , Rfl:    sertraline (ZOLOFT) 100 MG tablet, Take 1  tablet by mouth daily., Disp: , Rfl:    Syringe/Needle, Disp, (SYRINGE 3CC/25GX1") 25G X 1" 3 ML MISC, 3 mLs by Does not apply route every 30 (thirty) days., Disp: 12 each, Rfl: 0   fluticasone (FLONASE) 50 MCG/ACT nasal spray, Place into the nose. (Patient not taking: Reported on 08/15/2020), Disp: , Rfl:    loratadine (CLARITIN) 10 MG tablet, Take by mouth. (Patient not taking: Reported on 08/15/2020), Disp: , Rfl:    Melatonin 10 MG TABS, Take by mouth as needed. (Patient not taking: Reported on 08/16/2021), Disp: , Rfl:  No current facility-administered medications for this visit.  Facility-Administered Medications Ordered in Other Visits:    sodium chloride flush (NS) 0.9 % injection 10 mL, 10 mL, Intracatheter, Once PRN, Karen Kitchens, NP   sodium chloride flush (NS) 0.9 % injection 3 mL, 3 mL, Intracatheter, Once PRN, Karen Kitchens, NP  Physical exam:  Vitals:   08/16/21 1440  BP: 132/89  Pulse: 74  Resp: 20  Temp: 98.8 F (37.1 C)  SpO2: 100%  Weight: 150 lb 9.6 oz (68.3 kg)   Physical Exam Constitutional:      Appearance: Normal appearance.  HENT:     Head: Normocephalic and atraumatic.  Eyes:     Pupils: Pupils are equal, round, and reactive to light.  Cardiovascular:     Rate and Rhythm: Normal rate and regular rhythm.     Heart sounds: Normal heart sounds. No murmur heard. Pulmonary:     Effort: Pulmonary effort is normal.     Breath sounds: Normal breath sounds. No wheezing.  Abdominal:     General: Bowel sounds are normal. There is no distension.     Palpations: Abdomen is soft.     Tenderness: There is no abdominal tenderness.  Musculoskeletal:        General: Normal range of motion.     Cervical back: Normal range of motion.  Skin:    General: Skin is warm and dry.     Findings: No rash.  Neurological:     Mental Status: She is alert and oriented to person, place, and time.  Psychiatric:        Judgment: Judgment normal.     CMP Latest Ref Rng & Units  04/30/2018  Glucose 70 - 99 mg/dL 104(H)  BUN 6 - 20 mg/dL 11  Creatinine 0.44 - 1.00 mg/dL 0.73  Sodium 135 - 145 mmol/L 136  Potassium 3.5 - 5.1 mmol/L 3.9  Chloride 98 - 111 mmol/L 108  CO2 22 - 32 mmol/L 22  Calcium 8.9 - 10.3 mg/dL 9.0  Total Protein 6.5 - 8.1 g/dL 7.8  Total Bilirubin 0.3 - 1.2 mg/dL 0.3  Alkaline Phos 38 - 126 U/L 52  AST 15 - 41 U/L 12(L)  ALT 0 - 44 U/L 11   CBC Latest Ref Rng & Units 08/15/2021  WBC 4.0 - 10.5 K/uL 4.8  Hemoglobin 12.0 - 15.0 g/dL  12.7  Hematocrit 36.0 - 46.0 % 38.3  Platelets 150 - 400 K/uL 244     Assessment and plan- Patient is a 46 y.o. female with history of iron and B12 deficiency anemia here for routine follow-up.   Labs from 08/15/2021 show ferritin of 195 (451), iron saturations 30% (36%) and hemoglobin 12.7.  Iron levels are trending down but she is feeling well.  Recommend she return to clinic in 2 months for lab work, see Dr. Janese Banks and possible IV iron.  Previous note, Dr. Janese Banks recommends iron infusion for ferritin less than 50 or iron saturation less than 20%.  No iron needed today.  Disposition- No iron today.  RTC in 2 months for follow-up with lab work, see MD and possible IV iron.  Visit Diagnosis 1. Iron deficiency anemia due to chronic blood loss    Faythe Casa, NP 08/16/2021 3:46 PM

## 2021-10-22 ENCOUNTER — Other Ambulatory Visit: Payer: Self-pay

## 2021-10-22 ENCOUNTER — Inpatient Hospital Stay: Payer: 59 | Attending: Oncology

## 2021-10-22 DIAGNOSIS — D509 Iron deficiency anemia, unspecified: Secondary | ICD-10-CM | POA: Insufficient documentation

## 2021-10-22 DIAGNOSIS — D500 Iron deficiency anemia secondary to blood loss (chronic): Secondary | ICD-10-CM

## 2021-10-22 DIAGNOSIS — E538 Deficiency of other specified B group vitamins: Secondary | ICD-10-CM | POA: Diagnosis not present

## 2021-10-22 DIAGNOSIS — Z79899 Other long term (current) drug therapy: Secondary | ICD-10-CM | POA: Insufficient documentation

## 2021-10-22 LAB — CBC
HCT: 38.1 % (ref 36.0–46.0)
Hemoglobin: 12.5 g/dL (ref 12.0–15.0)
MCH: 30.1 pg (ref 26.0–34.0)
MCHC: 32.8 g/dL (ref 30.0–36.0)
MCV: 91.8 fL (ref 80.0–100.0)
Platelets: 284 10*3/uL (ref 150–400)
RBC: 4.15 MIL/uL (ref 3.87–5.11)
RDW: 12.7 % (ref 11.5–15.5)
WBC: 5.8 10*3/uL (ref 4.0–10.5)
nRBC: 0 % (ref 0.0–0.2)

## 2021-10-22 LAB — IRON AND TIBC
Iron: 66 ug/dL (ref 28–170)
Saturation Ratios: 17 % (ref 10.4–31.8)
TIBC: 400 ug/dL (ref 250–450)
UIBC: 334 ug/dL

## 2021-10-22 LAB — PREGNANCY, URINE: Preg Test, Ur: NEGATIVE

## 2021-10-22 LAB — FERRITIN: Ferritin: 66 ng/mL (ref 11–307)

## 2021-10-24 ENCOUNTER — Encounter: Payer: Self-pay | Admitting: Oncology

## 2021-10-24 ENCOUNTER — Inpatient Hospital Stay: Payer: 59

## 2021-10-24 ENCOUNTER — Inpatient Hospital Stay: Payer: 59 | Admitting: Oncology

## 2021-10-24 ENCOUNTER — Other Ambulatory Visit: Payer: Self-pay

## 2021-10-24 VITALS — BP 130/80 | HR 90

## 2021-10-24 VITALS — BP 123/77 | HR 115 | Resp 16 | Wt 150.2 lb

## 2021-10-24 DIAGNOSIS — D500 Iron deficiency anemia secondary to blood loss (chronic): Secondary | ICD-10-CM | POA: Diagnosis not present

## 2021-10-24 DIAGNOSIS — E538 Deficiency of other specified B group vitamins: Secondary | ICD-10-CM | POA: Diagnosis not present

## 2021-10-24 DIAGNOSIS — D509 Iron deficiency anemia, unspecified: Secondary | ICD-10-CM | POA: Diagnosis not present

## 2021-10-24 MED ORDER — IRON SUCROSE 20 MG/ML IV SOLN
200.0000 mg | Freq: Once | INTRAVENOUS | Status: AC
  Administered 2021-10-24: 200 mg via INTRAVENOUS
  Filled 2021-10-24: qty 10

## 2021-10-24 MED ORDER — SODIUM CHLORIDE 0.9 % IV SOLN
Freq: Once | INTRAVENOUS | Status: AC
  Filled 2021-10-24: qty 250

## 2021-10-24 MED ORDER — SODIUM CHLORIDE 0.9 % IV SOLN: 200.0000 mg | INTRAVENOUS | Status: DC

## 2021-10-24 NOTE — Progress Notes (Signed)
Pt states that her VITAMIN D was also low, she will like to add that to the list to be checked every so often. Had a VITAMIND D injection back in January. She also wants her B12 checked since it was not checked on Monday.  ?

## 2021-10-25 ENCOUNTER — Encounter: Payer: Self-pay | Admitting: Urgent Care

## 2021-10-25 NOTE — Progress Notes (Signed)
? ? ? ?Hematology/Oncology Consult note ?[place]  ?Telephone:([phone] [phone] ? ?Patient Care Team: ?Benjaman Kindler, MD as PCP - General (Obstetrics and Gynecology) ?Geanie Kenning, PA-C (Gastroenterology) ?Alice Reichert, Benay Pike, MD as Consulting Physician (Gastroenterology)  ? ?Name of the patient: Alicia Washington  ?[id number]  ?[birth year]  ? ?Date of visit: 10/25/21 ? ?Diagnosis- history of iron and B12 deficiency ? ?Chief complaint/ Reason for visit- routine f/u of iron deficiency anemia ? ?Heme/Onc history: Patient is a 46 year old female with history of iron deficiency of unclear etiology.  She has received IV iron in the past.  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  Colonoscopy on 05/11/2018 was normal.  Random biopsies revealed no abnormalities.  Capsule enteroscopy on 06/15/2018 was normal.  Urinalysis on 05/17/2018 and 12/15/2018 revealed no hematuria. ?  ?Also has a history of B12 deficiency and currently on B12 injections ?  ? ?Interval history- reports feeling anxious. Stats that when her iron levels go down she starts feeling anxious. Reports fatigue ? ?ECOG PS- 0 ?Pain scale- 0 ? ? ?Review of systems- Review of Systems  ?Constitutional:  Negative for chills, fever, malaise/fatigue and weight loss.  ?HENT:  Negative for congestion, ear discharge and nosebleeds.   ?Eyes:  Negative for blurred vision.  ?Respiratory:  Negative for cough, hemoptysis, sputum production, shortness of breath and wheezing.   ?Cardiovascular:  Negative for chest pain, palpitations, orthopnea and claudication.  ?Gastrointestinal:  Negative for abdominal pain, blood in stool, constipation, diarrhea, heartburn, melena, nausea and vomiting.  ?Genitourinary:  Negative for dysuria, flank pain, frequency, hematuria and urgency.  ?Musculoskeletal:   Negative for back pain, joint pain and myalgias.  ?Skin:  Negative for rash.  ?Neurological:  Negative for dizziness, tingling, focal weakness, seizures, weakness and headaches.  ?Endo/Heme/Allergies:  Does not bruise/bleed easily.  ?Psychiatric/Behavioral:  Negative for depression and suicidal ideas. The patient is nervous/anxious. The patient does not have insomnia.    ? ? ? ?No Known Allergies ? ? ?Past Medical History:  ?Diagnosis Date  ? Cancer [facility])   ? skin  ? Fibromyalgia   ? Low iron   ? ? ? ?Past Surgical History:  ?Procedure Laterality Date  ? BREAST BIOPSY Left   ? core- neg  ? CESAREAN SECTION    ? ENDOMETRIAL ABLATION    ? ? ?Social History  ? ?Socioeconomic History  ? Marital status: Married  ?  Spouse name: Not on file  ? Number of children: Not on file  ? Years of education: Not on file  ? Highest education level: Not on file  ?Occupational History  ? Not on file  ?Tobacco Use  ? Smoking status: Never  ? Smokeless tobacco: Never  ?Vaping Use  ? Vaping Use: Never used  ?Substance and Sexual Activity  ? Alcohol use: No  ? Drug use: Never  ? Sexual activity: Not on file  ?Other Topics Concern  ? Not on file  ?Social History Narrative  ? Not on file  ? ?Social Determinants of Health  ? ?Financial Resource Strain: Not on file  ?Food Insecurity: Not on file  ?Transportation Needs: Not on file  ?Physical Activity: Not on file  ?Stress: Not on file  ?Social Connections: Not on file  ?Intimate Partner Violence: Not on file  ? ? ?Family History  ?Problem Relation Age of Onset  ?  Cancer Mother   ? Cancer Maternal Grandfather   ? Breast cancer Neg Hx   ? ? ? ?Current Outpatient Medications:  ?  cyanocobalamin (,VITAMIN B-12,) 1000 MCG/ML injection, SMARTSIG:1000 MCG IM Once a Month, Disp: , Rfl:  ?  Cyanocobalamin 1000 MCG/ML KIT, Inject 1,000 mcg as directed every 30 (thirty) days., Disp: 1 kit, Rfl: 11 ?  IRON SUCROSE IV, Inject into the vein every 30 (thirty) days., Disp: , Rfl:  ?  sertraline (ZOLOFT) 100  MG tablet, Take 100 mg by mouth daily. , Disp: , Rfl:  ?  fluticasone (FLONASE) 50 MCG/ACT nasal spray, Place into the nose. (Patient not taking: Reported on 08/15/2020), Disp: , Rfl:  ?  loratadine (CLARITIN) 10 MG tablet, Take by mouth. (Patient not taking: Reported on 08/15/2020), Disp: , Rfl:  ?  Melatonin 10 MG TABS, Take by mouth as needed. (Patient not taking: Reported on 08/16/2021), Disp: , Rfl:  ?  predniSONE (DELTASONE) 10 MG tablet, Take by mouth. (Patient not taking: Reported on 10/24/2021), Disp: , Rfl:  ?  sertraline (ZOLOFT) 100 MG tablet, Take 1 tablet by mouth daily., Disp: , Rfl:  ?  Syringe/Needle, Disp, (SYRINGE 3CC/25GX1") 25G X 1" 3 ML MISC, 3 mLs by Does not apply route every 30 (thirty) days. (Patient not taking: Reported on 10/24/2021), Disp: 12 each, Rfl: 0 ?No current facility-administered medications for this visit. ? ?Facility-Administered Medications Ordered in Other Visits:  ?  sodium chloride flush (NS) 0.9 % injection 10 mL, 10 mL, Intracatheter, Once PRN, Karen Kitchens, NP ?  sodium chloride flush (NS) 0.9 % injection 3 mL, 3 mL, Intracatheter, Once PRN, Karen Kitchens, NP ? ?Physical exam:  ?Vitals:  ? 10/24/21 1417  ?BP: 123/77  ?Pulse: (!) 115  ?Resp: 16  ?SpO2: 97%  ?Weight: 150 lb 3.2 oz (68.1 kg)  ? ?Physical Exam ?Constitutional:   ?   General: She is not in acute distress. ?Cardiovascular:  ?   Rate and Rhythm: Normal rate and regular rhythm.  ?   Heart sounds: Normal heart sounds.  ?Pulmonary:  ?   Effort: Pulmonary effort is normal.  ?   Breath sounds: Normal breath sounds.  ?Skin: ?   General: Skin is warm and dry.  ?Neurological:  ?   Mental Status: She is alert and oriented to person, place, and time.  ?  ? ? ?  Latest Ref Rng & Units 04/30/2018  ? 10:40 AM  ?CMP  ?Glucose 70 - 99 mg/dL 104    ?BUN 6 - 20 mg/dL 11    ?Creatinine 0.44 - 1.00 mg/dL 0.73    ?Sodium 135 - 145 mmol/L 136    ?Potassium 3.5 - 5.1 mmol/L 3.9    ?Chloride 98 - 111 mmol/L 108    ?CO2 22 - 32 mmol/L 22     ?Calcium 8.9 - 10.3 mg/dL 9.0    ?Total Protein 6.5 - 8.1 g/dL 7.8    ?Total Bilirubin 0.3 - 1.2 mg/dL 0.3    ?Alkaline Phos 38 - 126 U/L 52    ?AST 15 - 41 U/L 12    ?ALT 0 - 44 U/L 11    ? ? ?  Latest Ref Rng & Units 10/22/2021  ? 12:58 PM  ?CBC  ?WBC 4.0 - 10.5 K/uL 5.8    ?Hemoglobin 12.0 - 15.0 g/dL 12.5    ?Hematocrit 36.0 - 46.0 % 38.1    ?Platelets 150 - 400 K/uL 284    ? ? ? ? ?  Assessment and plan- Patient is a 46 y.o. female here for routine follow-up of iron deficiency anemia ? ?Patient is not presently anemicWith a hemoglobin of 12.7/38.3.  Ferritin levels are normal at 66 although they have trended down in the last 4 months from 4 51-1 95-66 presently.  Iron saturation at 17% with high normal TIBC of 400.  Patient states that she feels symptomatic and she has borderline iron deficiency with an iron saturation of less than 20%.  I will proceed with 2 doses of Venofer at this time and continue to monitor her iron studies every 3 months and see her back in 6 months ?  ?Visit Diagnosis ?1. Iron deficiency anemia due to chronic blood loss   ?2. B12 deficiency   ? ? ? ?Dr. Randa Evens, MD, MPH ?[facility] at [facility] ?[id number] ?10/25/2021 ?1:01 PM ? ? ? ? ? ? ?    ? ? ? ? ? ?

## 2021-10-31 ENCOUNTER — Inpatient Hospital Stay: Payer: 59

## 2021-10-31 VITALS — BP 111/65 | HR 78 | Temp 96.0°F | Resp 18

## 2021-10-31 DIAGNOSIS — D500 Iron deficiency anemia secondary to blood loss (chronic): Secondary | ICD-10-CM

## 2021-10-31 DIAGNOSIS — D509 Iron deficiency anemia, unspecified: Secondary | ICD-10-CM | POA: Diagnosis not present

## 2021-10-31 MED ORDER — SODIUM CHLORIDE 0.9 % IV SOLN: 200.0000 mg | INTRAVENOUS | Status: DC

## 2021-10-31 MED ORDER — SODIUM CHLORIDE 0.9 % IV SOLN
Freq: Once | INTRAVENOUS | Status: AC
  Filled 2021-10-31: qty 250

## 2021-10-31 MED ORDER — IRON SUCROSE 20 MG/ML IV SOLN
200.0000 mg | Freq: Once | INTRAVENOUS | Status: AC
  Administered 2021-10-31: 200 mg via INTRAVENOUS
  Filled 2021-10-31: qty 10

## 2021-10-31 NOTE — Patient Instructions (Signed)
[facility]  Discharge Instructions: ?Thank you for choosing [facility] to provide your oncology and hematology care.  ?If you have a lab appointment with the Cancer Center, please go directly to the Cancer Center and check in at the registration area. ? ?Wear comfortable clothing and clothing appropriate for easy access to any Portacath or PICC line.  ? ?We strive to give you quality time with your provider. You may need to reschedule your appointment if you arrive late (15 or more minutes).  Arriving late affects you and other patients whose appointments are after yours.  Also, if you miss three or more appointments without notifying the office, you may be dismissed from the clinic at the provider?s discretion.    ?  ?For prescription refill requests, have your pharmacy contact our office and allow 72 hours for refills to be completed.   ? ?Today you received the following chemotherapy and/or immunotherapy agents VENOFER    ?  ?To help prevent nausea and vomiting after your treatment, we encourage you to take your nausea medication as directed. ? ?BELOW ARE SYMPTOMS THAT SHOULD BE REPORTED IMMEDIATELY: ?*FEVER GREATER THAN 100.4 F (38 ?C) OR HIGHER ?*CHILLS OR SWEATING ?*NAUSEA AND VOMITING THAT IS NOT CONTROLLED WITH YOUR NAUSEA MEDICATION ?*UNUSUAL SHORTNESS OF BREATH ?*UNUSUAL BRUISING OR BLEEDING ?*URINARY PROBLEMS (pain or burning when urinating, or frequent urination) ?*BOWEL PROBLEMS (unusual diarrhea, constipation, pain near the anus) ?TENDERNESS IN MOUTH AND THROAT WITH OR WITHOUT PRESENCE OF ULCERS (sore throat, sores in mouth, or a toothache) ?UNUSUAL RASH, SWELLING OR PAIN  ?UNUSUAL VAGINAL DISCHARGE OR ITCHING  ? ?Items with * indicate a potential emergency and should be followed up as soon as possible or go to the Emergency Department if any problems should occur. ? ?Please show the CHEMOTHERAPY ALERT CARD or IMMUNOTHERAPY ALERT CARD at check-in to the  Emergency Department and triage nurse. ? ?Should you have questions after your visit or need to cancel or reschedule your appointment, please contact [facility]  [phone] and follow the prompts.  Office hours are 8:00 a.m. to 4:30 p.m. Monday - Friday. Please note that voicemails left after 4:00 p.m. may not be returned until the following business day.  We are closed weekends and major holidays. You have access to a nurse at all times for urgent questions. Please call the main number to the clinic [phone] and follow the prompts. ? ?For any non-urgent questions, you may also contact your provider using MyChart. We now offer e-Visits for anyone 18 and older to request care online for non-urgent symptoms. For details visit [email or web address]. ?  ?Also download the MyChart app! Go to the app store, search "MyChart", open the app, select [facility], and log in with your MyChart username and password. ? ?Due to Covid, a mask is required upon entering the hospital/clinic. If you do not have a mask, one will be given to you upon arrival. For doctor visits, patients may have 1 support person aged 18 or older with them. For treatment visits, patients cannot have anyone with them due to current Covid guidelines and our immunocompromised population.  ? ?Iron Sucrose Injection ?What is this medication? ?IRON SUCROSE (EYE ern SOO krose) treats low levels of iron (iron deficiency anemia) in people with kidney disease. Iron is a mineral that plays an important role in making red blood cells, which carry oxygen from your lungs to the rest of your body. ?This medicine may   be used for other purposes; ask your health care provider or pharmacist if you have questions. ?COMMON BRAND NAME(S): Venofer ?What should I tell my care team before I take this medication? ?They need to know if you have any of these conditions: ?Anemia not caused by low iron levels ?Heart disease ?High levels of  iron in the blood ?Kidney disease ?Liver disease ?An unusual or allergic reaction to iron, other medications, foods, dyes, or preservatives ?Pregnant or trying to get pregnant ?Breast-feeding ?How should I use this medication? ?This medication is for infusion into a vein. It is given in a hospital or clinic setting. ?Talk to your care team about the use of this medication in children. While this medication may be prescribed for children as young as 2 years for selected conditions, precautions do apply. ?Overdosage: If you think you have taken too much of this medicine contact a poison control center or emergency room at once. ?NOTE: This medicine is only for you. Do not share this medicine with others. ?What if I miss a dose? ?It is important not to miss your dose. Call your care team if you are unable to keep an appointment. ?What may interact with this medication? ?Do not take this medication with any of the following: ?Deferoxamine ?Dimercaprol ?Other iron products ?This medication may also interact with the following: ?Chloramphenicol ?Deferasirox ?This list may not describe all possible interactions. Give your health care provider a list of all the medicines, herbs, non-prescription drugs, or dietary supplements you use. Also tell them if you smoke, drink alcohol, or use illegal drugs. Some items may interact with your medicine. ?What should I watch for while using this medication? ?Visit your care team regularly. Tell your care team if your symptoms do not start to get better or if they get worse. You may need blood work done while you are taking this medication. ?You may need to follow a special diet. Talk to your care team. Foods that contain iron include: whole grains/cereals, dried fruits, beans, or peas, leafy green vegetables, and organ meats (liver, kidney). ?What side effects may I notice from receiving this medication? ?Side effects that you should report to your care team as soon as  possible: ?Allergic reactions--skin rash, itching, hives, swelling of the face, lips, tongue, or throat ?Low blood pressure--dizziness, feeling faint or lightheaded, blurry vision ?Shortness of breath ?Side effects that usually do not require medical attention (report to your care team if they continue or are bothersome): ?Flushing ?Headache ?Joint pain ?Muscle pain ?Nausea ?Pain, redness, or irritation at injection site ?This list may not describe all possible side effects. Call your doctor for medical advice about side effects. You may report side effects to FDA at 1-800-FDA-1088. ?Where should I keep my medication? ?This medication is given in a hospital or clinic and will not be stored at home. ?NOTE: This sheet is a summary. It may not cover all possible information. If you have questions about this medicine, talk to your doctor, pharmacist, or health care provider. ?? 2022 Elsevier/Gold Standard (2020-12-15 00:00:00) ? ?

## 2022-01-16 ENCOUNTER — Other Ambulatory Visit: Payer: Self-pay | Admitting: Obstetrics and Gynecology

## 2022-01-16 DIAGNOSIS — Z1231 Encounter for screening mammogram for malignant neoplasm of breast: Secondary | ICD-10-CM

## 2022-01-24 ENCOUNTER — Other Ambulatory Visit: Payer: 59

## 2022-01-28 ENCOUNTER — Inpatient Hospital Stay: Payer: 59 | Attending: Oncology

## 2022-01-28 DIAGNOSIS — E538 Deficiency of other specified B group vitamins: Secondary | ICD-10-CM | POA: Diagnosis present

## 2022-01-28 DIAGNOSIS — D508 Other iron deficiency anemias: Secondary | ICD-10-CM | POA: Insufficient documentation

## 2022-01-28 DIAGNOSIS — D500 Iron deficiency anemia secondary to blood loss (chronic): Secondary | ICD-10-CM

## 2022-01-28 LAB — CBC
HCT: 39.8 % (ref 36.0–46.0)
Hemoglobin: 13.1 g/dL (ref 12.0–15.0)
MCH: 28.5 pg (ref 26.0–34.0)
MCHC: 32.9 g/dL (ref 30.0–36.0)
MCV: 86.5 fL (ref 80.0–100.0)
Platelets: 283 10*3/uL (ref 150–400)
RBC: 4.6 MIL/uL (ref 3.87–5.11)
RDW: 13 % (ref 11.5–15.5)
WBC: 5.1 10*3/uL (ref 4.0–10.5)
nRBC: 0 % (ref 0.0–0.2)

## 2022-01-28 LAB — IRON AND TIBC
Iron: 58 ug/dL (ref 28–170)
Saturation Ratios: 14 % (ref 10.4–31.8)
TIBC: 403 ug/dL (ref 250–450)
UIBC: 345 ug/dL

## 2022-01-28 LAB — FERRITIN: Ferritin: 31 ng/mL (ref 11–307)

## 2022-01-28 LAB — VITAMIN B12: Vitamin B-12: 739 pg/mL (ref 180–914)

## 2022-01-30 ENCOUNTER — Ambulatory Visit
Admission: RE | Admit: 2022-01-30 | Discharge: 2022-01-30 | Disposition: A | Discharge status: Home / Self Care | Payer: 59 | Source: Ambulatory Visit | Attending: Obstetrics and Gynecology | Admitting: Obstetrics and Gynecology

## 2022-01-30 DIAGNOSIS — Z1231 Encounter for screening mammogram for malignant neoplasm of breast: Secondary | ICD-10-CM | POA: Insufficient documentation

## 2022-02-06 ENCOUNTER — Other Ambulatory Visit: Payer: Self-pay | Admitting: Obstetrics and Gynecology

## 2022-02-06 DIAGNOSIS — N6489 Other specified disorders of breast: Secondary | ICD-10-CM

## 2022-02-06 DIAGNOSIS — R921 Mammographic calcification found on diagnostic imaging of breast: Secondary | ICD-10-CM

## 2022-02-06 DIAGNOSIS — R928 Other abnormal and inconclusive findings on diagnostic imaging of breast: Secondary | ICD-10-CM

## 2022-02-08 ENCOUNTER — Inpatient Hospital Stay: Payer: 59 | Attending: Oncology

## 2022-02-08 ENCOUNTER — Inpatient Hospital Stay: Payer: 59

## 2022-02-08 VITALS — BP 122/77 | HR 82 | Temp 98.2°F | Resp 18

## 2022-02-08 DIAGNOSIS — D500 Iron deficiency anemia secondary to blood loss (chronic): Secondary | ICD-10-CM | POA: Diagnosis not present

## 2022-02-08 DIAGNOSIS — E538 Deficiency of other specified B group vitamins: Secondary | ICD-10-CM | POA: Diagnosis not present

## 2022-02-08 MED ORDER — SODIUM CHLORIDE 0.9 % IV SOLN
200.0000 mg | INTRAVENOUS | Status: DC
  Administered 2022-02-08: 200 mg via INTRAVENOUS
  Filled 2022-02-08: qty 10

## 2022-02-08 MED ORDER — SODIUM CHLORIDE 0.9 % IV SOLN
Freq: Once | INTRAVENOUS | Status: AC
  Filled 2022-02-08: qty 250

## 2022-02-08 MED ORDER — IRON SUCROSE 20 MG/ML IV SOLN: 200.0000 mg | Freq: Once | INTRAVENOUS | Status: DC

## 2022-02-08 MED ORDER — SODIUM CHLORIDE 0.9 % IV SOLN
200.0000 mg | Freq: Once | INTRAVENOUS | Status: DC
  Filled 2022-02-08: qty 10

## 2022-02-08 NOTE — Patient Instructions (Signed)
[facility]  Discharge Instructions: Thank you for choosing [facility] to provide your oncology and hematology care.  If you have a lab appointment with the Cancer Center, please go directly to the Cancer Center and check in at the registration area.  Wear comfortable clothing and clothing appropriate for easy access to any Portacath or PICC line.   We strive to give you quality time with your provider. You may need to reschedule your appointment if you arrive late (15 or more minutes).  Arriving late affects you and other patients whose appointments are after yours.  Also, if you miss three or more appointments without notifying the office, you may be dismissed from the clinic at the provider's discretion.      For prescription refill requests, have your pharmacy contact our office and allow 72 hours for refills to be completed.    Today you received the following chemotherapy and/or immunotherapy agents Venofer.      To help prevent nausea and vomiting after your treatment, we encourage you to take your nausea medication as directed.  BELOW ARE SYMPTOMS THAT SHOULD BE REPORTED IMMEDIATELY: *FEVER GREATER THAN 100.4 F (38 C) OR HIGHER *CHILLS OR SWEATING *NAUSEA AND VOMITING THAT IS NOT CONTROLLED WITH YOUR NAUSEA MEDICATION *UNUSUAL SHORTNESS OF BREATH *UNUSUAL BRUISING OR BLEEDING *URINARY PROBLEMS (pain or burning when urinating, or frequent urination) *BOWEL PROBLEMS (unusual diarrhea, constipation, pain near the anus) TENDERNESS IN MOUTH AND THROAT WITH OR WITHOUT PRESENCE OF ULCERS (sore throat, sores in mouth, or a toothache) UNUSUAL RASH, SWELLING OR PAIN  UNUSUAL VAGINAL DISCHARGE OR ITCHING   Items with * indicate a potential emergency and should be followed up as soon as possible or go to the Emergency Department if any problems should occur.  Please show the CHEMOTHERAPY ALERT CARD or IMMUNOTHERAPY ALERT CARD at check-in to  the Emergency Department and triage nurse.  Should you have questions after your visit or need to cancel or reschedule your appointment, please contact [facility]  [phone] and follow the prompts.  Office hours are 8:00 a.m. to 4:30 p.m. Monday - Friday. Please note that voicemails left after 4:00 p.m. may not be returned until the following business day.  We are closed weekends and major holidays. You have access to a nurse at all times for urgent questions. Please call the main number to the clinic [phone] and follow the prompts.  For any non-urgent questions, you may also contact your provider using MyChart. We now offer e-Visits for anyone 18 and older to request care online for non-urgent symptoms. For details visit [email or web address].   Also download the MyChart app! Go to the app store, search "MyChart", open the app, select [facility], and log in with your MyChart username and password.  Masks are optional in the cancer centers. If you would like for your care team to wear a mask while they are taking care of you, please let them know. For doctor visits, patients may have with them one support person who is at least 46 years old. At this time, visitors are not allowed in the infusion area.   

## 2022-02-11 ENCOUNTER — Inpatient Hospital Stay: Payer: 59

## 2022-02-11 VITALS — BP 109/80 | HR 84 | Temp 96.1°F | Resp 18

## 2022-02-11 DIAGNOSIS — D500 Iron deficiency anemia secondary to blood loss (chronic): Secondary | ICD-10-CM | POA: Diagnosis not present

## 2022-02-11 MED ORDER — SODIUM CHLORIDE 0.9 % IV SOLN
Freq: Once | INTRAVENOUS | Status: AC
  Filled 2022-02-11: qty 250

## 2022-02-11 MED ORDER — SODIUM CHLORIDE 0.9 % IV SOLN
200.0000 mg | INTRAVENOUS | Status: DC
  Administered 2022-02-11: 200 mg via INTRAVENOUS
  Filled 2022-02-11: qty 10

## 2022-02-14 ENCOUNTER — Inpatient Hospital Stay: Payer: 59

## 2022-02-14 ENCOUNTER — Other Ambulatory Visit: Payer: Self-pay | Admitting: *Deleted

## 2022-02-14 VITALS — BP 137/87 | HR 85 | Temp 96.7°F | Resp 16

## 2022-02-14 DIAGNOSIS — D500 Iron deficiency anemia secondary to blood loss (chronic): Secondary | ICD-10-CM | POA: Diagnosis not present

## 2022-02-14 MED ORDER — SODIUM CHLORIDE 0.9 % IV SOLN
200.0000 mg | Freq: Once | INTRAVENOUS | Status: DC
  Filled 2022-02-14: qty 10

## 2022-02-14 MED ORDER — SODIUM CHLORIDE 0.9 % IV SOLN
200.0000 mg | INTRAVENOUS | Status: DC
  Administered 2022-02-14: 200 mg via INTRAVENOUS
  Filled 2022-02-14: qty 10

## 2022-02-14 MED ORDER — SODIUM CHLORIDE 0.9 % IV SOLN
Freq: Once | INTRAVENOUS | Status: AC
  Filled 2022-02-14: qty 250

## 2022-02-14 MED ORDER — IRON SUCROSE 20 MG/ML IV SOLN: 200.0000 mg | Freq: Once | INTRAVENOUS | Status: DC

## 2022-02-14 MED ORDER — CYANOCOBALAMIN 1000 MCG/ML IJ SOLN: INTRAMUSCULAR | 11 refills | Status: DC

## 2022-02-20 ENCOUNTER — Inpatient Hospital Stay: Payer: 59

## 2022-02-22 ENCOUNTER — Inpatient Hospital Stay: Payer: 59

## 2022-02-25 ENCOUNTER — Ambulatory Visit
Admission: RE | Admit: 2022-02-25 | Discharge: 2022-02-25 | Disposition: A | Discharge status: Home / Self Care | Payer: 59 | Source: Ambulatory Visit | Attending: Obstetrics and Gynecology | Admitting: Obstetrics and Gynecology

## 2022-02-25 DIAGNOSIS — R921 Mammographic calcification found on diagnostic imaging of breast: Secondary | ICD-10-CM | POA: Diagnosis present

## 2022-02-25 DIAGNOSIS — R928 Other abnormal and inconclusive findings on diagnostic imaging of breast: Secondary | ICD-10-CM | POA: Diagnosis present

## 2022-02-25 DIAGNOSIS — N6489 Other specified disorders of breast: Secondary | ICD-10-CM | POA: Insufficient documentation

## 2022-02-27 ENCOUNTER — Inpatient Hospital Stay: Payer: 59

## 2022-03-01 ENCOUNTER — Other Ambulatory Visit: Payer: Self-pay | Admitting: Obstetrics and Gynecology

## 2022-03-01 ENCOUNTER — Inpatient Hospital Stay: Payer: 59

## 2022-03-01 VITALS — BP 124/86 | HR 72 | Temp 97.1°F | Resp 16

## 2022-03-01 DIAGNOSIS — R921 Mammographic calcification found on diagnostic imaging of breast: Secondary | ICD-10-CM

## 2022-03-01 DIAGNOSIS — R928 Other abnormal and inconclusive findings on diagnostic imaging of breast: Secondary | ICD-10-CM

## 2022-03-01 DIAGNOSIS — D500 Iron deficiency anemia secondary to blood loss (chronic): Secondary | ICD-10-CM

## 2022-03-01 MED ORDER — SODIUM CHLORIDE 0.9 % IV SOLN
200.0000 mg | Freq: Once | INTRAVENOUS | Status: AC
  Administered 2022-03-01: 200 mg via INTRAVENOUS
  Filled 2022-03-01: qty 200

## 2022-03-04 ENCOUNTER — Inpatient Hospital Stay: Payer: 59

## 2022-03-04 VITALS — BP 131/86 | HR 79 | Temp 96.3°F | Resp 18

## 2022-03-04 DIAGNOSIS — D500 Iron deficiency anemia secondary to blood loss (chronic): Secondary | ICD-10-CM | POA: Diagnosis not present

## 2022-03-04 MED ORDER — SODIUM CHLORIDE 0.9 % IV SOLN
200.0000 mg | Freq: Once | INTRAVENOUS | Status: AC
  Administered 2022-03-04: 200 mg via INTRAVENOUS
  Filled 2022-03-04: qty 200

## 2022-03-04 MED ORDER — SODIUM CHLORIDE 0.9 % IV SOLN
INTRAVENOUS | Status: DC
  Filled 2022-03-04: qty 250

## 2022-03-04 NOTE — Patient Instructions (Signed)
[facility]  Discharge Instructions: Thank you for choosing [facility] to provide your oncology and hematology care.  If you have a lab appointment with the Cancer Center, please go directly to the Cancer Center and check in at the registration area.  Wear comfortable clothing and clothing appropriate for easy access to any Portacath or PICC line.   We strive to give you quality time with your provider. You may need to reschedule your appointment if you arrive late (15 or more minutes).  Arriving late affects you and other patients whose appointments are after yours.  Also, if you miss three or more appointments without notifying the office, you may be dismissed from the clinic at the provider's discretion.      For prescription refill requests, have your pharmacy contact our office and allow 72 hours for refills to be completed.    Today you received the following chemotherapy and/or immunotherapy agents VENOFER      To help prevent nausea and vomiting after your treatment, we encourage you to take your nausea medication as directed.  BELOW ARE SYMPTOMS THAT SHOULD BE REPORTED IMMEDIATELY: *FEVER GREATER THAN 100.4 F (38 C) OR HIGHER *CHILLS OR SWEATING *NAUSEA AND VOMITING THAT IS NOT CONTROLLED WITH YOUR NAUSEA MEDICATION *UNUSUAL SHORTNESS OF BREATH *UNUSUAL BRUISING OR BLEEDING *URINARY PROBLEMS (pain or burning when urinating, or frequent urination) *BOWEL PROBLEMS (unusual diarrhea, constipation, pain near the anus) TENDERNESS IN MOUTH AND THROAT WITH OR WITHOUT PRESENCE OF ULCERS (sore throat, sores in mouth, or a toothache) UNUSUAL RASH, SWELLING OR PAIN  UNUSUAL VAGINAL DISCHARGE OR ITCHING   Items with * indicate a potential emergency and should be followed up as soon as possible or go to the Emergency Department if any problems should occur.  Please show the CHEMOTHERAPY ALERT CARD or IMMUNOTHERAPY ALERT CARD at check-in to the  Emergency Department and triage nurse.  Should you have questions after your visit or need to cancel or reschedule your appointment, please contact [facility]  [phone] and follow the prompts.  Office hours are 8:00 a.m. to 4:30 p.m. Monday - Friday. Please note that voicemails left after 4:00 p.m. may not be returned until the following business day.  We are closed weekends and major holidays. You have access to a nurse at all times for urgent questions. Please call the main number to the clinic [phone] and follow the prompts.  For any non-urgent questions, you may also contact your provider using MyChart. We now offer e-Visits for anyone 18 and older to request care online for non-urgent symptoms. For details visit [email or web address].   Also download the MyChart app! Go to the app store, search "MyChart", open the app, select [facility], and log in with your MyChart username and password.  Masks are optional in the cancer centers. If you would like for your care team to wear a mask while they are taking care of you, please let them know. For doctor visits, patients may have with them one support person who is at least 46 years old. At this time, visitors are not allowed in the infusion area.   Iron Sucrose Injection What is this medication? IRON SUCROSE (EYE ern SOO krose) treats low levels of iron (iron deficiency anemia) in people with kidney disease. Iron is a mineral that plays an important role in making red blood cells, which carry oxygen from your lungs to the rest of your body. This medicine may   be used for other purposes; ask your health care provider or pharmacist if you have questions. COMMON BRAND NAME(S): Venofer What should I tell my care team before I take this medication? They need to know if you have any of these conditions: Anemia not caused by low iron levels Heart disease High levels of iron in the blood Kidney disease Liver  disease An unusual or allergic reaction to iron, other medications, foods, dyes, or preservatives Pregnant or trying to get pregnant Breast-feeding How should I use this medication? This medication is for infusion into a vein. It is given in a hospital or clinic setting. Talk to your care team about the use of this medication in children. While this medication may be prescribed for children as young as 2 years for selected conditions, precautions do apply. Overdosage: If you think you have taken too much of this medicine contact a poison control center or emergency room at once. NOTE: This medicine is only for you. Do not share this medicine with others. What if I miss a dose? It is important not to miss your dose. Call your care team if you are unable to keep an appointment. What may interact with this medication? Do not take this medication with any of the following: Deferoxamine Dimercaprol Other iron products This medication may also interact with the following: Chloramphenicol Deferasirox This list may not describe all possible interactions. Give your health care provider a list of all the medicines, herbs, non-prescription drugs, or dietary supplements you use. Also tell them if you smoke, drink alcohol, or use illegal drugs. Some items may interact with your medicine. What should I watch for while using this medication? Visit your care team regularly. Tell your care team if your symptoms do not start to get better or if they get worse. You may need blood work done while you are taking this medication. You may need to follow a special diet. Talk to your care team. Foods that contain iron include: whole grains/cereals, dried fruits, beans, or peas, leafy green vegetables, and organ meats (liver, kidney). What side effects may I notice from receiving this medication? Side effects that you should report to your care team as soon as possible: Allergic reactions--skin rash, itching, hives,  swelling of the face, lips, tongue, or throat Low blood pressure--dizziness, feeling faint or lightheaded, blurry vision Shortness of breath Side effects that usually do not require medical attention (report to your care team if they continue or are bothersome): Flushing Headache Joint pain Muscle pain Nausea Pain, redness, or irritation at injection site This list may not describe all possible side effects. Call your doctor for medical advice about side effects. You may report side effects to FDA at 1-800-FDA-1088. Where should I keep my medication? This medication is given in a hospital or clinic and will not be stored at home. NOTE: This sheet is a summary. It may not cover all possible information. If you have questions about this medicine, talk to your doctor, pharmacist, or health care provider.  2023 Elsevier/Gold Standard (2020-12-15 00:00:00)   

## 2022-03-07 ENCOUNTER — Ambulatory Visit
Admission: RE | Admit: 2022-03-07 | Discharge: 2022-03-07 | Disposition: A | Discharge status: Home / Self Care | Payer: 59 | Source: Ambulatory Visit | Attending: Obstetrics and Gynecology | Admitting: Obstetrics and Gynecology

## 2022-03-07 DIAGNOSIS — R921 Mammographic calcification found on diagnostic imaging of breast: Secondary | ICD-10-CM | POA: Insufficient documentation

## 2022-03-07 DIAGNOSIS — R928 Other abnormal and inconclusive findings on diagnostic imaging of breast: Secondary | ICD-10-CM | POA: Insufficient documentation

## 2022-03-07 HISTORY — PX: BREAST BIOPSY: SHX20

## 2022-03-08 LAB — SURGICAL PATHOLOGY

## 2022-04-29 ENCOUNTER — Other Ambulatory Visit: Payer: 59

## 2022-04-29 ENCOUNTER — Ambulatory Visit: Payer: 59 | Admitting: Oncology

## 2022-05-10 ENCOUNTER — Inpatient Hospital Stay: Payer: 59 | Attending: Oncology

## 2022-05-10 DIAGNOSIS — D500 Iron deficiency anemia secondary to blood loss (chronic): Secondary | ICD-10-CM

## 2022-05-10 DIAGNOSIS — E538 Deficiency of other specified B group vitamins: Secondary | ICD-10-CM

## 2022-05-10 DIAGNOSIS — Z79899 Other long term (current) drug therapy: Secondary | ICD-10-CM | POA: Diagnosis not present

## 2022-05-10 DIAGNOSIS — D509 Iron deficiency anemia, unspecified: Secondary | ICD-10-CM | POA: Insufficient documentation

## 2022-05-10 LAB — CBC
HCT: 38.6 % (ref 36.0–46.0)
Hemoglobin: 13.4 g/dL (ref 12.0–15.0)
MCH: 30.7 pg (ref 26.0–34.0)
MCHC: 34.7 g/dL (ref 30.0–36.0)
MCV: 88.3 fL (ref 80.0–100.0)
Platelets: 276 10*3/uL (ref 150–400)
RBC: 4.37 MIL/uL (ref 3.87–5.11)
RDW: 12.9 % (ref 11.5–15.5)
WBC: 4.4 10*3/uL (ref 4.0–10.5)
nRBC: 0 % (ref 0.0–0.2)

## 2022-05-10 LAB — IRON AND TIBC
Iron: 64 ug/dL (ref 28–170)
Saturation Ratios: 17 % (ref 10.4–31.8)
TIBC: 375 ug/dL (ref 250–450)
UIBC: 311 ug/dL

## 2022-05-10 LAB — FERRITIN: Ferritin: 134 ng/mL (ref 11–307)

## 2022-05-13 ENCOUNTER — Inpatient Hospital Stay: Payer: 59 | Admitting: Oncology

## 2022-05-13 ENCOUNTER — Encounter: Payer: Self-pay | Admitting: Oncology

## 2022-05-13 VITALS — BP 129/72 | HR 80 | Temp 97.0°F | Wt 153.0 lb

## 2022-05-13 DIAGNOSIS — D509 Iron deficiency anemia, unspecified: Secondary | ICD-10-CM | POA: Diagnosis not present

## 2022-05-13 NOTE — Progress Notes (Signed)
Hematology/Oncology Consult note [facility]  Telephone:([phone] [phone]  Patient Care Team: Benjaman Kindler, MD as PCP - General (Obstetrics and Gynecology) Reeves Forth (Gastroenterology) Efrain Sella, MD as Consulting Physician (Gastroenterology)   Name of the patient: Alicia Washington  [id number]  [birth year]   Date of visit: 05/13/22  Diagnosis- history of iron and B12 deficiency  Chief complaint/ Reason for visit-routine follow-up of iron deficiency anemia  Heme/Onc history: Patient is a 46 year old female with history of iron deficiency of unclear etiology.  She has received IV iron in the past.  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  Colonoscopy on 05/11/2018 was normal.  Random biopsies revealed no abnormalities.  Capsule enteroscopy on 06/15/2018 was normal.  Urinalysis on 05/17/2018 and 12/15/2018 revealed no hematuria.   Also has a history of B12 deficiency and currently on B12 injections  Interval history-patient reports improvement in her energy levels after receiving 5 doses of Venofer in July 2023.  ECOG PS- 0 Pain scale- 0   Review of systems- Review of Systems  Constitutional:  Negative for chills, fever, malaise/fatigue and weight loss.  HENT:  Negative for congestion, ear discharge and nosebleeds.   Eyes:  Negative for blurred vision.  Respiratory:  Negative for cough, hemoptysis, sputum production, shortness of breath and wheezing.   Cardiovascular:  Negative for chest pain, palpitations, orthopnea and claudication.  Gastrointestinal:  Negative for abdominal pain, blood in stool, constipation, diarrhea, heartburn, melena, nausea and vomiting.  Genitourinary:  Negative for dysuria, flank pain, frequency, hematuria and urgency.  Musculoskeletal:  Negative  for back pain, joint pain and myalgias.  Skin:  Negative for rash.  Neurological:  Negative for dizziness, tingling, focal weakness, seizures, weakness and headaches.  Endo/Heme/Allergies:  Does not bruise/bleed easily.  Psychiatric/Behavioral:  Negative for depression and suicidal ideas. The patient does not have insomnia.       No Known Allergies   Past Medical History:  Diagnosis Date   Cancer ([place])    skin   Fibromyalgia    Low iron      Past Surgical History:  Procedure Laterality Date   BREAST BIOPSY Left    core- neg   BREAST BIOPSY Right 03/07/2022   stereo bx, calcs "X" clip-path pending   CESAREAN SECTION     ENDOMETRIAL ABLATION      Social History   Socioeconomic History   Marital status: Married    Spouse name: Not on file   Number of children: Not on file   Years of education: Not on file   Highest education level: Not on file  Occupational History   Not on file  Tobacco Use   Smoking status: Never   Smokeless tobacco: Never  Vaping Use   Vaping Use: Never used  Substance and Sexual Activity   Alcohol use: No   Drug use: Never   Sexual activity: Not on file  Other Topics Concern   Not on file  Social History Narrative   Not on file   Social Determinants of Health   Financial Resource Strain: Not on file  Food Insecurity: Not on file  Transportation Needs: Not on file  Physical Activity: Not on file  Stress: Not on file  Social Connections: Not on file  Intimate Partner Violence: Not on file    Family History  Problem  Relation Age of Onset   Cancer Mother    Cancer Maternal Grandfather    Breast cancer Neg Hx      Current Outpatient Medications:    cyanocobalamin (,VITAMIN B-12,) 1000 MCG/ML injection, SMARTSIG:1000 MCG IM Once a Month, Disp: 1 mL, Rfl: 11   Cyanocobalamin 1000 MCG/ML KIT, Inject 1,000 mcg as directed every 30 (thirty) days., Disp: 1 kit, Rfl: 11   IRON SUCROSE IV, Inject into the vein every 30 (thirty) days.,  Disp: , Rfl:    sertraline (ZOLOFT) 100 MG tablet, Take 100 mg by mouth daily. , Disp: , Rfl:    sertraline (ZOLOFT) 100 MG tablet, Take 1 tablet by mouth daily., Disp: , Rfl:    fluticasone (FLONASE) 50 MCG/ACT nasal spray, Place into the nose. (Patient not taking: Reported on 08/15/2020), Disp: , Rfl:    loratadine (CLARITIN) 10 MG tablet, Take by mouth. (Patient not taking: Reported on 08/15/2020), Disp: , Rfl:    Melatonin 10 MG TABS, Take by mouth as needed. (Patient not taking: Reported on 08/16/2021), Disp: , Rfl:    predniSONE (DELTASONE) 10 MG tablet, Take by mouth. (Patient not taking: Reported on 10/24/2021), Disp: , Rfl:    Syringe/Needle, Disp, (SYRINGE 3CC/25GX1") 25G X 1" 3 ML MISC, 3 mLs by Does not apply route every 30 (thirty) days. (Patient not taking: Reported on 10/24/2021), Disp: 12 each, Rfl: 0 No current facility-administered medications for this visit.  Facility-Administered Medications Ordered in Other Visits:    sodium chloride flush (NS) 0.9 % injection 10 mL, 10 mL, Intracatheter, Once PRN, Karen Kitchens, NP   sodium chloride flush (NS) 0.9 % injection 3 mL, 3 mL, Intracatheter, Once PRN, Karen Kitchens, NP  Physical exam:  Vitals:   05/13/22 1451  BP: 129/72  Pulse: 80  Temp: (!) 97 F (36.1 C)  TempSrc: Tympanic  Weight: 153 lb (69.4 kg)   Physical Exam Cardiovascular:     Rate and Rhythm: Normal rate and regular rhythm.     Heart sounds: Normal heart sounds.  Pulmonary:     Effort: Pulmonary effort is normal.  Skin:    General: Skin is warm and dry.  Neurological:     Mental Status: She is alert and oriented to person, place, and time.         Latest Ref Rng & Units 04/30/2018   10:40 AM  CMP  Glucose 70 - 99 mg/dL 104   BUN 6 - 20 mg/dL 11   Creatinine 0.44 - 1.00 mg/dL 0.73   Sodium 135 - 145 mmol/L 136   Potassium 3.5 - 5.1 mmol/L 3.9   Chloride 98 - 111 mmol/L 108   CO2 22 - 32 mmol/L 22   Calcium 8.9 - 10.3 mg/dL 9.0   Total Protein 6.5 -  8.1 g/dL 7.8   Total Bilirubin 0.3 - 1.2 mg/dL 0.3   Alkaline Phos 38 - 126 U/L 52   AST 15 - 41 U/L 12   ALT 0 - 44 U/L 11       Latest Ref Rng & Units 05/10/2022    1:28 PM  CBC  WBC 4.0 - 10.5 K/uL 4.4   Hemoglobin 12.0 - 15.0 g/dL 13.4   Hematocrit 36.0 - 46.0 % 38.6   Platelets 150 - 400 K/uL 276      Assessment and plan- Patient is a 46 y.o. female with history of iron deficiency anemia here for routine follow-up  After receiving IV iron, ferritin levels improved  from 31-1 34.  Although hemoglobin has remained stable around 12.5-13.5 with or without IV iron patient reports feeling symptomatically better when she receives IV iron after her ferritin falls to less than 50.  I will therefore continue to monitor her CBC ferritin and iron studies every 3 months and see her back in 6 months.  Because of her iron deficiency anemia is unclear however.  She has had an endometrial ablation and therefore does not get her menstrual cycles.  She has had a complete GI evaluation in 2019 and no cause of bleeding was found. Visit Diagnosis 1. Iron deficiency anemia, unspecified iron deficiency anemia type      Dr. Randa Evens, MD, MPH [facility] [phone] 05/13/2022 4:51 PM

## 2022-07-09 ENCOUNTER — Ambulatory Visit
Admission: EM | Admit: 2022-07-09 | Discharge: 2022-07-09 | Disposition: A | Discharge status: Home / Self Care | Payer: 59 | Attending: Nurse Practitioner | Admitting: Nurse Practitioner

## 2022-07-09 ENCOUNTER — Encounter: Payer: Self-pay | Admitting: Emergency Medicine

## 2022-07-09 DIAGNOSIS — M62838 Other muscle spasm: Secondary | ICD-10-CM | POA: Insufficient documentation

## 2022-07-09 LAB — MAGNESIUM: Magnesium: 2.2 mg/dL (ref 1.7–2.4)

## 2022-07-09 LAB — BASIC METABOLIC PANEL
Anion gap: 7 (ref 5–15)
BUN: 11 mg/dL (ref 6–20)
CO2: 26 mmol/L (ref 22–32)
Calcium: 9.5 mg/dL (ref 8.9–10.3)
Chloride: 104 mmol/L (ref 98–111)
Creatinine, Ser: 0.86 mg/dL (ref 0.44–1.00)
GFR, Estimated: >60 mL/min (ref >60)
Glucose, Bld: 107 mg/dL — ABNORMAL HIGH (ref 70–99)
Potassium: 3.9 mmol/L (ref 3.5–5.1)
Sodium: 137 mmol/L (ref 135–145)

## 2022-07-09 MED ORDER — CYCLOBENZAPRINE HCL 10 MG PO TABS: 10.0000 mg | ORAL_TABLET | Freq: Every day | ORAL | 0 refills | Status: AC

## 2022-07-09 NOTE — ED Provider Notes (Signed)
[facility]    CSN: [id number] Arrival date & time: 07/09/22  1653      History   Chief Complaint Chief Complaint  Patient presents with  . Leg Pain    HPI Alicia Washington is a 46 y.o. female presents for evaluation of leg pain. Pt reports last night she developed right posterior thigh muscle spasms that have been intermittent since that time. Today she reports a soreness to the thigh that extends to her medial knee. States it feels like a sore muscle.  She feels medial knee is swollen. No redness or warmth. No CP or SOB. No injury or known inciting event. Flew back from [employer] [place] 2 weeks ago. She has not taken any OTC medications for her sx. No hx of DVT or clotting disorders. No other concerns at this time.    Leg Pain   Past Medical History:  Diagnosis Date  . Cancer ([place])    skin  . Fibromyalgia   . Low iron     Patient Active Problem List   Diagnosis Date Noted  . Other fatigue 08/15/2020  . B12 deficiency 05/02/2018  . Iron deficiency anemia 04/30/2018    Past Surgical History:  Procedure Laterality Date  . BREAST BIOPSY Left    core- neg  . BREAST BIOPSY Right 03/07/2022   stereo bx, calcs "X" clip-path pending  . CESAREAN SECTION    . ENDOMETRIAL ABLATION      OB History   No obstetric history on file.      Home Medications    Prior to Admission medications   Medication Sig Start Date End Date Taking? Authorizing Provider  cyanocobalamin (,VITAMIN B-12,) 1000 MCG/ML injection SMARTSIG:1000 MCG IM Once a Month 02/14/22  Yes Sindy Guadeloupe, MD  cyclobenzaprine (FLEXERIL) 10 MG tablet Take 1 tablet (10 mg total) by mouth at bedtime for 5 days. 07/09/22 07/14/22 Yes Melynda Ripple, NP  IRON SUCROSE IV Inject into the vein every 30 (thirty) days.   Yes [provider]  sertraline (ZOLOFT) 100 MG tablet Take 100 mg by mouth daily.    Yes [provider]  Cyanocobalamin 1000 MCG/ML KIT Inject 1,000 mcg as directed every 30  (thirty) days. 02/07/21   Sindy Guadeloupe, MD  fluticasone (FLONASE) 50 MCG/ACT nasal spray Place into the nose. Patient not taking: Reported on 08/15/2020    [provider]  loratadine (CLARITIN) 10 MG tablet Take by mouth. Patient not taking: Reported on 08/15/2020    [provider]  Melatonin 10 MG TABS Take by mouth as needed. Patient not taking: Reported on 08/16/2021    [provider]  predniSONE (DELTASONE) 10 MG tablet Take by mouth. Patient not taking: Reported on 10/24/2021 05/03/21   [provider]  sertraline (ZOLOFT) 100 MG tablet Take 1 tablet by mouth daily. 10/02/20   [provider]  Syringe/Needle, Disp, (SYRINGE 3CC/25GX1") 25G X 1" 3 ML MISC 3 mLs by Does not apply route every 30 (thirty) days. Patient not taking: Reported on 10/24/2021 04/11/21   Sindy Guadeloupe, MD    Family History Family History  Problem Relation Age of Onset  . Cancer Mother   . Cancer Maternal Grandfather   . Breast cancer Neg Hx     Social History Social History   Tobacco Use  . Smoking status: Never  . Smokeless tobacco: Never  Vaping Use  . Vaping Use: Never used  Substance Use Topics  . Alcohol use: No  .  Drug use: Never     Allergies   Patient has no known allergies.   Review of Systems Review of Systems  Musculoskeletal:        Right thigh pain     Physical Exam Triage Vital Signs ED Triage Vitals  Enc Vitals Group     BP 07/09/22 1757 (!) 150/85     Pulse Rate 07/09/22 1757 89     Resp 07/09/22 1757 16     Temp 07/09/22 1757 98.7 F (37.1 C)     Temp Source 07/09/22 1757 Oral     SpO2 07/09/22 1757 98 %     Weight 07/09/22 1756 153 lb (69.4 kg)     Height 07/09/22 1756 _0  (1.575 m)     Head Circumference --      Peak Flow --      Pain Score 07/09/22 1755 4     Pain Loc --      Pain Edu? --      Excl. in [place]? --    No data found.  Updated Vital Signs BP (!) 150/85 (BP Location: Right Arm)   Pulse 89   Temp  98.7 F (37.1 C) (Oral)   Resp 16   Ht _1  (1.575 m)   Wt 153 lb (69.4 kg)   SpO2 98%   BMI 27.98 kg/m   Visual Acuity Right Eye Distance:   Left Eye Distance:   Bilateral Distance:    Right Eye Near:   Left Eye Near:    Bilateral Near:     Physical Exam Vitals and nursing note reviewed.  Constitutional:      General: She is not in acute distress.    Appearance: Normal appearance. She is not ill-appearing.  HENT:     Head: Normocephalic and atraumatic.  Eyes:     Pupils: Pupils are equal, round, and reactive to light.  Cardiovascular:     Rate and Rhythm: Normal rate.  Pulmonary:     Effort: Pulmonary effort is normal.  Musculoskeletal:       Legs:  Skin:    General: Skin is warm and dry.  Neurological:     General: No focal deficit present.     Mental Status: She is alert and oriented to person, place, and time.  Psychiatric:        Mood and Affect: Mood normal.        Behavior: Behavior normal.   Clinical feature Score     Active cancer (treatment ongoing or within the previous six months or palliative) 0  Paralysis, paresis, or recent plaster immobilization of the lower extremities 0  Recently bedridden for more than three days or major surgery, within four weeks                                                    0  Localized tenderness along the distribution of the deep venous system 0  Entire leg swollen 0  Calf swelling by more than 3 cm when compared to the asymptomatic leg (measured below tibial tuberosity) 0  Pitting edema (greater in the symptomatic leg) 0  Collateral superficial veins (nonvaricose) 0  Alternative diagnosis as likely or more likely than that of deep venous thrombosis -2  Total Score -2     Interpretation    High probability  3 or greater  Moderate probability 1 or 2  Low  probability 0 or less  Modification   This clinical model has been modified to take one other clinical feature into account: a previously documented deep vein thrombosis (DVT) is given the score of 1. Using this modified scoring system, DVT is either likely or unlikely, as follows:  DVT likely 2 or greater   DVT unlikely 1 or less      UC Treatments / Results  Labs (all labs ordered are listed, but only abnormal results are displayed) Labs Reviewed  MAGNESIUM  BASIC METABOLIC PANEL    EKG   Radiology No results found.  Procedures Procedures (including critical care time)  Medications Ordered in UC Medications - No data to display  Initial Impression / Assessment and Plan / UC Course  I have reviewed the triage vital signs and the nursing notes.  Pertinent labs & imaging results that were available during my care of the patient were reviewed by me and considered in my medical decision making (see chart for details).     Reviewed exam and sx with patient. No red flags on exam and VSS Wells score -2 with low probability for DVT Discussed muscle spasm. Will check Mg, potassium, and Ca. Trial of flexeril and OTC NSAIDS as needed Cool compresses and rest  Follow up with PCP in 2-3 days for re-check  ER precautions reviewed and pt verbalized understanding  Final Clinical Impressions(s) / UC Diagnoses   Final diagnoses:  Muscle spasm     Discharge Instructions      Flexeril as needed. Caution as this medication can make you drowsy. Do not drive a care or drink alcohol while taking this medication  You may take over the counter ibuprofen as needed Cool compresses to the leg as needed Follow up with your PCP in 2-3 days for re-check  Please go to the ER for any worsening symptoms  I hope you feel better soon!   ED Prescriptions     Medication Sig Dispense Auth. Provider   cyclobenzaprine (FLEXERIL) 10 MG tablet Take 1 tablet (10 mg total) by mouth at bedtime for 5  days. 5 tablet Melynda Ripple, NP      PDMP not reviewed this encounter.   Melynda Ripple, NP 07/09/22 [phone]

## 2022-07-09 NOTE — ED Triage Notes (Signed)
Pt c/o right leg pain. Started last night. She states she woke up last night with spasms/cramps and since then she has had spasm and has a knot above the knee. No bruising or discoloration to the area.

## 2022-07-09 NOTE — Discharge Instructions (Signed)
Flexeril as needed. Caution as this medication can make you drowsy. Do not drive a care or drink alcohol while taking this medication  You may take over the counter ibuprofen as needed Cool compresses to the leg as needed Follow up with your PCP in 2-3 days for re-check  Please go to the ER for any worsening symptoms  I hope you feel better soon!

## 2022-08-09 ENCOUNTER — Inpatient Hospital Stay: Payer: 59 | Attending: Oncology

## 2022-08-09 DIAGNOSIS — D509 Iron deficiency anemia, unspecified: Secondary | ICD-10-CM

## 2022-08-09 LAB — IRON AND TIBC
Iron: 65 ug/dL (ref 28–170)
Saturation Ratios: 18 % (ref 10.4–31.8)
TIBC: 357 ug/dL (ref 250–450)
UIBC: 292 ug/dL

## 2022-08-09 LAB — FERRITIN: Ferritin: 58 ng/mL (ref 11–307)

## 2022-08-09 LAB — CBC WITH DIFFERENTIAL/PLATELET
Abs Immature Granulocytes: 0.06 10*3/uL (ref 0.00–0.07)
Basophils Absolute: 0.1 10*3/uL (ref 0.0–0.1)
Basophils Relative: 1 %
Eosinophils Absolute: 0.1 10*3/uL (ref 0.0–0.5)
Eosinophils Relative: 3 %
HCT: 37.3 % (ref 36.0–46.0)
Hemoglobin: 12.6 g/dL (ref 12.0–15.0)
Immature Granulocytes: 1 %
Lymphocytes Relative: 29 %
Lymphs Abs: 1.5 10*3/uL (ref 0.7–4.0)
MCH: 29.9 pg (ref 26.0–34.0)
MCHC: 33.8 g/dL (ref 30.0–36.0)
MCV: 88.6 fL (ref 80.0–100.0)
Monocytes Absolute: 0.4 10*3/uL (ref 0.1–1.0)
Monocytes Relative: 8 %
Neutro Abs: 3.1 10*3/uL (ref 1.7–7.7)
Neutrophils Relative %: 58 %
Platelets: 277 10*3/uL (ref 150–400)
RBC: 4.21 MIL/uL (ref 3.87–5.11)
RDW: 12.3 % (ref 11.5–15.5)
WBC: 5.2 10*3/uL (ref 4.0–10.5)
nRBC: 0 % (ref 0.0–0.2)

## 2022-08-13 ENCOUNTER — Other Ambulatory Visit: Payer: 59

## 2022-09-30 ENCOUNTER — Telehealth: Payer: Self-pay | Admitting: Oncology

## 2022-09-30 NOTE — Telephone Encounter (Signed)
pt called in stating that she was told to call in if she starts to feel bad prior to next scheduled appt. Pt has been scheduled for labs this friday to check levels and would like to be contacted if anything is needed.

## 2022-10-04 ENCOUNTER — Other Ambulatory Visit: Payer: Self-pay | Admitting: *Deleted

## 2022-10-04 ENCOUNTER — Inpatient Hospital Stay: Payer: 59 | Attending: Oncology

## 2022-10-04 DIAGNOSIS — D509 Iron deficiency anemia, unspecified: Secondary | ICD-10-CM | POA: Insufficient documentation

## 2022-10-04 DIAGNOSIS — E538 Deficiency of other specified B group vitamins: Secondary | ICD-10-CM | POA: Diagnosis not present

## 2022-10-04 LAB — CBC
HCT: 40.3 % (ref 36.0–46.0)
Hemoglobin: 13.1 g/dL (ref 12.0–15.0)
MCH: 28.7 pg (ref 26.0–34.0)
MCHC: 32.5 g/dL (ref 30.0–36.0)
MCV: 88.2 fL (ref 80.0–100.0)
Platelets: 266 10*3/uL (ref 150–400)
RBC: 4.57 MIL/uL (ref 3.87–5.11)
RDW: 12.1 % (ref 11.5–15.5)
WBC: 5.4 10*3/uL (ref 4.0–10.5)
nRBC: 0 % (ref 0.0–0.2)

## 2022-10-04 LAB — IRON AND TIBC
Iron: 80 ug/dL (ref 28–170)
Saturation Ratios: 25 % (ref 10.4–31.8)
TIBC: 321 ug/dL (ref 250–450)
UIBC: 241 ug/dL

## 2022-10-04 LAB — FERRITIN: Ferritin: 66 ng/mL (ref 11–307)

## 2022-10-10 ENCOUNTER — Encounter: Payer: Self-pay | Admitting: *Deleted

## 2022-11-12 ENCOUNTER — Inpatient Hospital Stay: Payer: 59

## 2022-11-12 ENCOUNTER — Inpatient Hospital Stay: Payer: 59 | Attending: Oncology

## 2022-11-12 ENCOUNTER — Encounter: Payer: Self-pay | Admitting: Urgent Care

## 2022-11-12 DIAGNOSIS — E538 Deficiency of other specified B group vitamins: Secondary | ICD-10-CM | POA: Diagnosis not present

## 2022-11-12 DIAGNOSIS — D509 Iron deficiency anemia, unspecified: Secondary | ICD-10-CM | POA: Insufficient documentation

## 2022-11-12 LAB — CBC WITH DIFFERENTIAL/PLATELET
Abs Immature Granulocytes: 0.02 10*3/uL (ref 0.00–0.07)
Basophils Absolute: 0 10*3/uL (ref 0.0–0.1)
Basophils Relative: 1 %
Eosinophils Absolute: 0.1 10*3/uL (ref 0.0–0.5)
Eosinophils Relative: 2 %
HCT: 39.5 % (ref 36.0–46.0)
Hemoglobin: 13.1 g/dL (ref 12.0–15.0)
Immature Granulocytes: 0 %
Lymphocytes Relative: 26 %
Lymphs Abs: 1.5 10*3/uL (ref 0.7–4.0)
MCH: 28.8 pg (ref 26.0–34.0)
MCHC: 33.2 g/dL (ref 30.0–36.0)
MCV: 86.8 fL (ref 80.0–100.0)
Monocytes Absolute: 0.5 10*3/uL (ref 0.1–1.0)
Monocytes Relative: 8 %
Neutro Abs: 3.8 10*3/uL (ref 1.7–7.7)
Neutrophils Relative %: 63 %
Platelets: 289 10*3/uL (ref 150–400)
RBC: 4.55 MIL/uL (ref 3.87–5.11)
RDW: 12.8 % (ref 11.5–15.5)
WBC: 6 10*3/uL (ref 4.0–10.5)
nRBC: 0 % (ref 0.0–0.2)

## 2022-11-12 LAB — FERRITIN: Ferritin: 109 ng/mL (ref 11–307)

## 2022-11-12 LAB — IRON AND TIBC
Iron: 47 ug/dL (ref 28–170)
Saturation Ratios: 15 % (ref 10.4–31.8)
TIBC: 315 ug/dL (ref 250–450)
UIBC: 268 ug/dL

## 2022-11-13 ENCOUNTER — Inpatient Hospital Stay: Payer: 59 | Admitting: Oncology

## 2022-11-20 ENCOUNTER — Inpatient Hospital Stay (HOSPITAL_BASED_OUTPATIENT_CLINIC_OR_DEPARTMENT_OTHER): Payer: 59 | Admitting: Oncology

## 2022-11-20 ENCOUNTER — Encounter: Payer: Self-pay | Admitting: Oncology

## 2022-11-20 DIAGNOSIS — E538 Deficiency of other specified B group vitamins: Secondary | ICD-10-CM | POA: Diagnosis not present

## 2022-11-20 DIAGNOSIS — D509 Iron deficiency anemia, unspecified: Secondary | ICD-10-CM

## 2022-11-24 ENCOUNTER — Encounter: Payer: Self-pay | Admitting: Urgent Care

## 2022-11-24 NOTE — Progress Notes (Signed)
I connected with Alicia Washington on 11/24/22 at  3:15 PM EDT by video enabled telemedicine visit and verified that I am speaking with the correct person using two identifiers.   I discussed the limitations, risks, security and privacy concerns of performing an evaluation and management service by telemedicine and the availability of in-person appointments. I also discussed with the patient that there may be a patient responsible charge related to this service. The patient expressed understanding and agreed to proceed.  Other persons participating in the visit and their role in the encounter:  none  Patient's location:  home Provider's location:  work  Stage manager Complaint:  routine f/u of iron deficiency anemia  History of present illness: Patient is a 47 year old female with history of iron deficiency of unclear etiology.  She has received IV iron in the past.  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  Colonoscopy on 05/11/2018 was normal.  Random biopsies revealed no abnormalities.  Capsule enteroscopy on 06/15/2018 was normal.  Urinalysis on 05/17/2018 and 12/15/2018 revealed no hematuria.   Also has a history of B12 deficiency and currently on B12 injections  Interval history she is doing well overall. Denies any significant fatigue at this time   Review of Systems  Constitutional:  Negative for chills, fever, malaise/fatigue and weight loss.  HENT:  Negative for congestion, ear discharge and nosebleeds.   Eyes:  Negative for blurred vision.  Respiratory:  Negative for cough, hemoptysis, sputum production, shortness of breath and wheezing.   Cardiovascular:  Negative for chest pain, palpitations, orthopnea and claudication.  Gastrointestinal:  Negative for abdominal pain, blood in stool, constipation, diarrhea, heartburn, melena, nausea and  vomiting.  Genitourinary:  Negative for dysuria, flank pain, frequency, hematuria and urgency.  Musculoskeletal:  Negative for back pain, joint pain and myalgias.  Skin:  Negative for rash.  Neurological:  Negative for dizziness, tingling, focal weakness, seizures, weakness and headaches.  Endo/Heme/Allergies:  Does not bruise/bleed easily.  Psychiatric/Behavioral:  Negative for depression and suicidal ideas. The patient does not have insomnia.     No Known Allergies  Past Medical History:  Diagnosis Date   Cancer    skin   Fibromyalgia    Low iron     Past Surgical History:  Procedure Laterality Date   BREAST BIOPSY Left    core- neg   BREAST BIOPSY Right 03/07/2022   stereo bx, calcs "X" clip-path pending   CESAREAN SECTION     ENDOMETRIAL ABLATION      Social History   Socioeconomic History   Marital status: Married    Spouse name: Not on file   Number of children: Not on file   Years of education: Not on file   Highest education level: Not on file  Occupational History   Not on file  Tobacco Use   Smoking status: Never   Smokeless tobacco: Never  Vaping Use   Vaping Use: Never used  Substance and Sexual Activity   Alcohol use: No   Drug use: Never   Sexual activity: Not on file  Other Topics Concern   Not on file  Social History Narrative   Not on file   Social Determinants of Health   Financial Resource Strain: Not on file  Food Insecurity: Not on file  Transportation Needs: Not on file  Physical Activity: Not on file  Stress: Not on  file  Social Connections: Not on file  Intimate Partner Violence: Not on file    Family History  Problem Relation Age of Onset   Cancer Mother    Cancer Maternal Grandfather    Breast cancer Neg Hx      Current Outpatient Medications:    cyanocobalamin (,VITAMIN B-12,) 1000 MCG/ML injection, SMARTSIG:1000 MCG IM Once a Month, Disp: 1 mL, Rfl: 11   sertraline (ZOLOFT) 100 MG tablet, Take 100 mg by mouth daily.  , Disp: , Rfl:    sertraline (ZOLOFT) 100 MG tablet, Take 1 tablet by mouth daily., Disp: , Rfl:    Syringe/Needle, Disp, (SYRINGE 3CC/25GX1") 25G X 1" 3 ML MISC, 3 mLs by Does not apply route every 30 (thirty) days., Disp: 12 each, Rfl: 0   Cyanocobalamin 1000 MCG/ML KIT, Inject 1,000 mcg as directed every 30 (thirty) days. (Patient not taking: Reported on 11/20/2022), Disp: 1 kit, Rfl: 11   IRON SUCROSE IV, Inject into the vein every 30 (thirty) days. (Patient not taking: Reported on 11/20/2022), Disp: , Rfl:  No current facility-administered medications for this visit.  Facility-Administered Medications Ordered in Other Visits:    sodium chloride flush (NS) 0.9 % injection 10 mL, 10 mL, Intracatheter, Once PRN, Verlee Monte, NP   sodium chloride flush (NS) 0.9 % injection 3 mL, 3 mL, Intracatheter, Once PRN, Verlee Monte, NP  No results found.  No images are attached to the encounter.      Latest Ref Rng & Units 07/09/2022    6:13 PM  CMP  Glucose 70 - 99 mg/dL 478   BUN 6 - 20 mg/dL 11   Creatinine 2.95 - 1.00 mg/dL 6.21   Sodium 308 - 657 mmol/L 137   Potassium 3.5 - 5.1 mmol/L 3.9   Chloride 98 - 111 mmol/L 104   CO2 22 - 32 mmol/L 26   Calcium 8.9 - 10.3 mg/dL 9.5       Latest Ref Rng & Units 11/12/2022    3:57 PM  CBC  WBC 4.0 - 10.5 K/uL 6.0   Hemoglobin 12.0 - 15.0 g/dL 84.6   Hematocrit 96.2 - 46.0 % 39.5   Platelets 150 - 400 K/uL 289      Observation/objective:appears in no acute distress over video visit today. Breathing is non labored  Assessment and plan:patient is a 47 year old female and this is a routine f/u visit for iron deficiency anemia  Patient is not anemic presently. Ferritin is normal and iron studies are normal. Iron saturation is <20% but overall patient is asymptmatic and does not need iv iron at this time.   Cbc ferritin and iron studies in 3 and 6 months. See dr Smith Robert in 6 months  Follow-up instructions:as above  I discussed the assessment  and treatment plan with the patient. The patient was provided an opportunity to ask questions and all were answered. The patient agreed with the plan and demonstrated an understanding of the instructions.   The patient was advised to call back or seek an in-person evaluation if the symptoms worsen or if the condition fails to improve as anticipated.  I provided 11 minutes of face-to-face video visit time during this encounter, and > 50% was spent counseling as documented under my assessment & plan.  Visit Diagnosis: 1. Iron deficiency anemia, unspecified iron deficiency anemia type   2. B12 deficiency     Dr. Owens Shark, MD, MPH [facility] at [facility] Tel- [phone] 11/24/2022  2:12 PM

## 2023-01-29 ENCOUNTER — Other Ambulatory Visit: Payer: Self-pay | Admitting: Oncology

## 2023-02-19 ENCOUNTER — Inpatient Hospital Stay: Payer: 59 | Attending: Oncology

## 2023-02-19 DIAGNOSIS — R5383 Other fatigue: Secondary | ICD-10-CM | POA: Diagnosis not present

## 2023-02-19 DIAGNOSIS — D509 Iron deficiency anemia, unspecified: Secondary | ICD-10-CM | POA: Insufficient documentation

## 2023-02-19 DIAGNOSIS — E538 Deficiency of other specified B group vitamins: Secondary | ICD-10-CM | POA: Insufficient documentation

## 2023-02-19 LAB — CBC
HCT: 37.5 % (ref 36.0–46.0)
Hemoglobin: 12.7 g/dL (ref 12.0–15.0)
MCH: 29.5 pg (ref 26.0–34.0)
MCHC: 33.9 g/dL (ref 30.0–36.0)
MCV: 87 fL (ref 80.0–100.0)
Platelets: 236 10*3/uL (ref 150–400)
RBC: 4.31 MIL/uL (ref 3.87–5.11)
RDW: 12.7 % (ref 11.5–15.5)
WBC: 5.3 10*3/uL (ref 4.0–10.5)
nRBC: 0 % (ref 0.0–0.2)

## 2023-02-19 LAB — TSH: TSH: 1.003 u[IU]/mL (ref 0.350–4.500)

## 2023-02-19 LAB — IRON AND TIBC
Iron: 62 ug/dL (ref 28–170)
Saturation Ratios: 18 % (ref 10.4–31.8)
TIBC: 343 ug/dL (ref 250–450)
UIBC: 281 ug/dL

## 2023-02-19 LAB — FERRITIN: Ferritin: 42 ng/mL (ref 11–307)

## 2023-02-19 LAB — VITAMIN B12: Vitamin B-12: 654 pg/mL (ref 180–914)

## 2023-02-25 ENCOUNTER — Other Ambulatory Visit: Payer: Self-pay | Admitting: Obstetrics and Gynecology

## 2023-02-25 DIAGNOSIS — Z1231 Encounter for screening mammogram for malignant neoplasm of breast: Secondary | ICD-10-CM

## 2023-03-25 ENCOUNTER — Ambulatory Visit
Admission: RE | Admit: 2023-03-25 | Discharge: 2023-03-25 | Disposition: A | Discharge status: Home / Self Care | Payer: 59 | Source: Ambulatory Visit | Attending: Obstetrics and Gynecology | Admitting: Obstetrics and Gynecology

## 2023-03-25 DIAGNOSIS — Z1231 Encounter for screening mammogram for malignant neoplasm of breast: Secondary | ICD-10-CM | POA: Insufficient documentation

## 2023-05-26 ENCOUNTER — Other Ambulatory Visit: Payer: Self-pay | Admitting: *Deleted

## 2023-05-26 ENCOUNTER — Other Ambulatory Visit: Payer: Self-pay

## 2023-05-26 ENCOUNTER — Inpatient Hospital Stay: Payer: 59 | Attending: Oncology

## 2023-05-26 DIAGNOSIS — E538 Deficiency of other specified B group vitamins: Secondary | ICD-10-CM | POA: Diagnosis not present

## 2023-05-26 DIAGNOSIS — D509 Iron deficiency anemia, unspecified: Secondary | ICD-10-CM | POA: Diagnosis present

## 2023-05-26 LAB — CBC
HCT: 36.8 % (ref 36.0–46.0)
Hemoglobin: 12.2 g/dL (ref 12.0–15.0)
MCH: 29.5 pg (ref 26.0–34.0)
MCHC: 33.2 g/dL (ref 30.0–36.0)
MCV: 89.1 fL (ref 80.0–100.0)
Platelets: 220 10*3/uL (ref 150–400)
RBC: 4.13 MIL/uL (ref 3.87–5.11)
RDW: 12.5 % (ref 11.5–15.5)
WBC: 4.7 10*3/uL (ref 4.0–10.5)
nRBC: 0 % (ref 0.0–0.2)

## 2023-05-26 LAB — IRON AND TIBC
Iron: 29 ug/dL (ref 28–170)
Saturation Ratios: 9 % — ABNORMAL LOW (ref 10.4–31.8)
TIBC: 314 ug/dL (ref 250–450)
UIBC: 285 ug/dL

## 2023-05-26 LAB — FERRITIN: Ferritin: 45 ng/mL (ref 11–307)

## 2023-05-27 ENCOUNTER — Ambulatory Visit: Payer: 59 | Admitting: Oncology

## 2023-05-27 ENCOUNTER — Other Ambulatory Visit: Payer: 59

## 2023-05-28 ENCOUNTER — Other Ambulatory Visit: Payer: 59

## 2023-05-28 ENCOUNTER — Inpatient Hospital Stay: Payer: 59 | Admitting: Oncology

## 2023-05-28 VITALS — BP 114/83 | HR 90 | Temp 97.6°F | Wt 130.0 lb

## 2023-05-28 DIAGNOSIS — D509 Iron deficiency anemia, unspecified: Secondary | ICD-10-CM | POA: Diagnosis not present

## 2023-05-28 DIAGNOSIS — E538 Deficiency of other specified B group vitamins: Secondary | ICD-10-CM

## 2023-05-28 NOTE — Progress Notes (Unsigned)
Patient is feeling more weak and fatigue.

## 2023-05-29 ENCOUNTER — Encounter: Payer: Self-pay | Admitting: Urgent Care

## 2023-05-29 ENCOUNTER — Encounter: Payer: Self-pay | Admitting: Oncology

## 2023-05-29 NOTE — Progress Notes (Signed)
Hematology/Oncology Consult note [facility]  Telephone:([phone] Fax:([phone]) [phone]  Patient Care Team: Christeen Douglas, MD as PCP - General (Obstetrics and Gynecology) Mickle Mallory (Gastroenterology) Stanton Kidney, MD as Consulting Physician (Gastroenterology)   Name of the patient: Alicia Washington  [id number]  [birth year]   Date of visit: 05/29/23  Diagnosis- iron deficiency anemia  Chief complaint/ Reason for visit- routine f/u of iron deficiency anemia  Heme/Onc history:  Patient is a 47 year old female with history of iron deficiency of unclear etiology.  She has received IV iron in the past.  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  Colonoscopy on 05/11/2018 was normal.  Random biopsies revealed no abnormalities.  Capsule enteroscopy on 06/15/2018 was normal.  Urinalysis on 05/17/2018 and 12/15/2018 revealed no hematuria.    Interval history- feels more fatigued than usual. Denies any blood in stool or urine  ECOG PS- 0 Pain scale- 0   Review of systems- Review of Systems  Constitutional:  Positive for malaise/fatigue. Negative for chills, fever and weight loss.  HENT:  Negative for congestion, ear discharge and nosebleeds.   Eyes:  Negative for blurred vision.  Respiratory:  Negative for cough, hemoptysis, sputum production, shortness of breath and wheezing.   Cardiovascular:  Negative for chest pain, palpitations, orthopnea and claudication.  Gastrointestinal:  Negative for abdominal pain, blood in stool, constipation, diarrhea, heartburn, melena, nausea and vomiting.  Genitourinary:  Negative for dysuria, flank pain, frequency, hematuria and urgency.  Musculoskeletal:  Negative for back pain, joint pain and myalgias.  Skin:  Negative for rash.  Neurological:  Negative for  dizziness, tingling, focal weakness, seizures, weakness and headaches.  Endo/Heme/Allergies:  Does not bruise/bleed easily.  Psychiatric/Behavioral:  Negative for depression and suicidal ideas. The patient does not have insomnia.       No Known Allergies   Past Medical History:  Diagnosis Date   Cancer ([facility])    skin   Fibromyalgia    Low iron      Past Surgical History:  Procedure Laterality Date   BREAST BIOPSY Left    core- neg   BREAST BIOPSY Right 03/07/2022   stereo bx, calcs "X" clip-path pending   CESAREAN SECTION     ENDOMETRIAL ABLATION      Social History   Socioeconomic History   Marital status: Married    Spouse name: Not on file   Number of children: Not on file   Years of education: Not on file   Highest education level: Not on file  Occupational History   Not on file  Tobacco Use   Smoking status: Never   Smokeless tobacco: Never  Vaping Use   Vaping status: Never Used  Substance and Sexual Activity   Alcohol use: No   Drug use: Never   Sexual activity: Not on file  Other Topics Concern   Not on file  Social History Narrative   Not on file   Social Determinants of Health   Financial Resource Strain: Not on file  Food Insecurity: Not on file  Transportation Needs: Not on file  Physical Activity: Not on file  Stress: Not on file  Social Connections: Not on file  Intimate Partner Violence: Not on file    Family History  Problem Relation Age of Onset   Cancer Mother    Cancer Maternal Grandfather  Breast cancer Neg Hx      Current Outpatient Medications:    cyanocobalamin (VITAMIN B12) 1000 MCG/ML injection, INJECT CONTENT OF 1 VIAL ONCE A MONTH, Disp: 1 mL, Rfl: 11   sertraline (ZOLOFT) 100 MG tablet, Take 100 mg by mouth daily. , Disp: , Rfl:    Syringe/Needle, Disp, (SYRINGE 3CC/25GX1") 25G X 1" 3 ML MISC, 3 mLs by Does not apply route every 30 (thirty) days., Disp: 12 each, Rfl: 0   Cyanocobalamin 1000 MCG/ML KIT, Inject  1,000 mcg as directed every 30 (thirty) days. (Patient not taking: Reported on 11/20/2022), Disp: 1 kit, Rfl: 11   IRON SUCROSE IV, Inject into the vein every 30 (thirty) days. (Patient not taking: Reported on 11/20/2022), Disp: , Rfl:    sertraline (ZOLOFT) 100 MG tablet, Take 1 tablet by mouth daily. (Patient not taking: Reported on 05/28/2023), Disp: , Rfl:  No current facility-administered medications for this visit.  Facility-Administered Medications Ordered in Other Visits:    sodium chloride flush (NS) 0.9 % injection 10 mL, 10 mL, Intracatheter, Once PRN, Verlee Monte, NP   sodium chloride flush (NS) 0.9 % injection 3 mL, 3 mL, Intracatheter, Once PRN, Verlee Monte, NP  Physical exam:  Vitals:   05/28/23 1412  BP: 114/83  Pulse: 90  Temp: 97.6 F (36.4 C)  TempSrc: Tympanic  SpO2: 98%  Weight: 130 lb (59 kg)   Physical Exam Cardiovascular:     Rate and Rhythm: Normal rate and regular rhythm.     Heart sounds: Normal heart sounds.  Pulmonary:     Effort: Pulmonary effort is normal.  Skin:    General: Skin is warm and dry.  Neurological:     Mental Status: She is alert and oriented to person, place, and time.         Latest Ref Rng & Units 07/09/2022    6:13 PM  CMP  Glucose 70 - 99 mg/dL 409   BUN 6 - 20 mg/dL 11   Creatinine 8.11 - 1.00 mg/dL 9.14   Sodium 782 - 956 mmol/L 137   Potassium 3.5 - 5.1 mmol/L 3.9   Chloride 98 - 111 mmol/L 104   CO2 22 - 32 mmol/L 26   Calcium 8.9 - 10.3 mg/dL 9.5       Latest Ref Rng & Units 05/26/2023    3:27 PM  CBC  WBC 4.0 - 10.5 K/uL 4.7   Hemoglobin 12.0 - 15.0 g/dL 21.3   Hematocrit 08.6 - 46.0 % 36.8   Platelets 150 - 400 K/uL 220      Assessment and plan- Patient is a 47 y.o. female here for routine follow-up of iron deficiency anemia  Patient is not currently anemic with an H&H of 12.2/36.8.  However iron studies reveal a low iron saturation of 9%.  Ferritin levels are low normal at 45.  Given her increasing  fatigue I think it would be reasonable to proceed with IV iron at this time.  I will plan to give her Venofer x 5.  Repeat CBC ferritin and iron studies in 4 and 8 months and I will see her back in 8 months   Visit Diagnosis 1. Iron deficiency anemia, unspecified iron deficiency anemia type   2. B12 deficiency      Dr. Owens Shark, MD, MPH [facility] at [facility] [phone] 05/29/2023 8:58 AM

## 2023-05-30 ENCOUNTER — Inpatient Hospital Stay: Payer: 59

## 2023-05-30 VITALS — BP 107/77 | Temp 97.4°F

## 2023-05-30 DIAGNOSIS — D500 Iron deficiency anemia secondary to blood loss (chronic): Secondary | ICD-10-CM

## 2023-05-30 DIAGNOSIS — D509 Iron deficiency anemia, unspecified: Secondary | ICD-10-CM | POA: Diagnosis not present

## 2023-05-30 MED ORDER — IRON SUCROSE 20 MG/ML IV SOLN
200.0000 mg | INTRAVENOUS | Status: DC
Start: 2023-05-30 — End: 2023-05-30
  Administered 2023-05-30: 200 mg via INTRAVENOUS

## 2023-05-30 NOTE — Patient Instructions (Signed)
Iron Sucrose Injection What is this medication? IRON SUCROSE (EYE ern SOO krose) treats low levels of iron (iron deficiency anemia) in people with kidney disease. Iron is a mineral that plays an important role in making red blood cells, which carry oxygen from your lungs to the rest of your body. This medicine may be used for other purposes; ask your health care provider or pharmacist if you have questions. COMMON BRAND NAME(S): Venofer What should I tell my care team before I take this medication? They need to know if you have any of these conditions: Anemia not caused by low iron levels Heart disease High levels of iron in the blood Kidney disease Liver disease An unusual or allergic reaction to iron, other medications, foods, dyes, or preservatives Pregnant or trying to get pregnant Breastfeeding How should I use this medication? This medication is for infusion into a vein. It is given in a hospital or clinic setting. Talk to your care team about the use of this medication in children. While this medication may be prescribed for children as young as 2 years for selected conditions, precautions do apply. Overdosage: If you think you have taken too much of this medicine contact a poison control center or emergency room at once. NOTE: This medicine is only for you. Do not share this medicine with others. What if I miss a dose? Keep appointments for follow-up doses. It is important not to miss your dose. Call your care team if you are unable to keep an appointment. What may interact with this medication? Do not take this medication with any of the following: Deferoxamine Dimercaprol Other iron products This medication may also interact with the following: Chloramphenicol Deferasirox This list may not describe all possible interactions. Give your health care provider a list of all the medicines, herbs, non-prescription drugs, or dietary supplements you use. Also tell them if you smoke,  drink alcohol, or use illegal drugs. Some items may interact with your medicine. What should I watch for while using this medication? Visit your care team regularly. Tell your care team if your symptoms do not start to get better or if they get worse. You may need blood work done while you are taking this medication. You may need to follow a special diet. Talk to your care team. Foods that contain iron include: whole grains/cereals, dried fruits, beans, or peas, leafy green vegetables, and organ meats (liver, kidney). What side effects may I notice from receiving this medication? Side effects that you should report to your care team as soon as possible: Allergic reactions--skin rash, itching, hives, swelling of the face, lips, tongue, or throat Low blood pressure--dizziness, feeling faint or lightheaded, blurry vision Shortness of breath Side effects that usually do not require medical attention (report to your care team if they continue or are bothersome): Flushing Headache Joint pain Muscle pain Nausea Pain, redness, or irritation at injection site This list may not describe all possible side effects. Call your doctor for medical advice about side effects. You may report side effects to FDA at 1-800-FDA-1088. Where should I keep my medication? This medication is given in a hospital or clinic. It will not be stored at home. NOTE: This sheet is a summary. It may not cover all possible information. If you have questions about this medicine, talk to your doctor, pharmacist, or health care provider.  2024 Elsevier/Gold Standard (2022-12-27 00:00:00)

## 2023-06-03 ENCOUNTER — Inpatient Hospital Stay: Payer: 59

## 2023-06-03 VITALS — BP 133/77 | HR 80 | Temp 96.2°F | Resp 17

## 2023-06-03 DIAGNOSIS — D509 Iron deficiency anemia, unspecified: Secondary | ICD-10-CM | POA: Diagnosis not present

## 2023-06-03 DIAGNOSIS — D500 Iron deficiency anemia secondary to blood loss (chronic): Secondary | ICD-10-CM

## 2023-06-03 MED ORDER — IRON SUCROSE 20 MG/ML IV SOLN
200.0000 mg | INTRAVENOUS | Status: DC
Start: 2023-06-03 — End: 2023-06-03
  Administered 2023-06-03: 200 mg via INTRAVENOUS
  Filled 2023-06-03: qty 10

## 2023-06-03 NOTE — Progress Notes (Signed)
Patient tolerated treatment well. Refused to wait 30 minute observation. Vital signs stable.

## 2023-06-04 ENCOUNTER — Inpatient Hospital Stay: Payer: 59

## 2023-06-06 ENCOUNTER — Inpatient Hospital Stay: Payer: 59 | Attending: Oncology

## 2023-06-06 VITALS — BP 118/81 | HR 78 | Temp 97.0°F | Resp 16

## 2023-06-06 DIAGNOSIS — D500 Iron deficiency anemia secondary to blood loss (chronic): Secondary | ICD-10-CM

## 2023-06-06 DIAGNOSIS — D509 Iron deficiency anemia, unspecified: Secondary | ICD-10-CM | POA: Diagnosis present

## 2023-06-06 MED ORDER — IRON SUCROSE 20 MG/ML IV SOLN
200.0000 mg | INTRAVENOUS | Status: DC
  Administered 2023-06-06: 200 mg via INTRAVENOUS
  Filled 2023-06-06: qty 10

## 2023-06-06 NOTE — Patient Instructions (Signed)
Iron Sucrose Injection What is this medication? IRON SUCROSE (EYE ern SOO krose) treats low levels of iron (iron deficiency anemia) in people with kidney disease. Iron is a mineral that plays an important role in making red blood cells, which carry oxygen from your lungs to the rest of your body. This medicine may be used for other purposes; ask your health care provider or pharmacist if you have questions. COMMON BRAND NAME(S): Venofer What should I tell my care team before I take this medication? They need to know if you have any of these conditions: Anemia not caused by low iron levels Heart disease High levels of iron in the blood Kidney disease Liver disease An unusual or allergic reaction to iron, other medications, foods, dyes, or preservatives Pregnant or trying to get pregnant Breastfeeding How should I use this medication? This medication is for infusion into a vein. It is given in a hospital or clinic setting. Talk to your care team about the use of this medication in children. While this medication may be prescribed for children as young as 2 years for selected conditions, precautions do apply. Overdosage: If you think you have taken too much of this medicine contact a poison control center or emergency room at once. NOTE: This medicine is only for you. Do not share this medicine with others. What if I miss a dose? Keep appointments for follow-up doses. It is important not to miss your dose. Call your care team if you are unable to keep an appointment. What may interact with this medication? Do not take this medication with any of the following: Deferoxamine Dimercaprol Other iron products This medication may also interact with the following: Chloramphenicol Deferasirox This list may not describe all possible interactions. Give your health care provider a list of all the medicines, herbs, non-prescription drugs, or dietary supplements you use. Also tell them if you smoke,  drink alcohol, or use illegal drugs. Some items may interact with your medicine. What should I watch for while using this medication? Visit your care team regularly. Tell your care team if your symptoms do not start to get better or if they get worse. You may need blood work done while you are taking this medication. You may need to follow a special diet. Talk to your care team. Foods that contain iron include: whole grains/cereals, dried fruits, beans, or peas, leafy green vegetables, and organ meats (liver, kidney). What side effects may I notice from receiving this medication? Side effects that you should report to your care team as soon as possible: Allergic reactions--skin rash, itching, hives, swelling of the face, lips, tongue, or throat Low blood pressure--dizziness, feeling faint or lightheaded, blurry vision Shortness of breath Side effects that usually do not require medical attention (report to your care team if they continue or are bothersome): Flushing Headache Joint pain Muscle pain Nausea Pain, redness, or irritation at injection site This list may not describe all possible side effects. Call your doctor for medical advice about side effects. You may report side effects to FDA at 1-800-FDA-1088. Where should I keep my medication? This medication is given in a hospital or clinic. It will not be stored at home. NOTE: This sheet is a summary. It may not cover all possible information. If you have questions about this medicine, talk to your doctor, pharmacist, or health care provider.  2024 Elsevier/Gold Standard (2022-12-27 00:00:00)

## 2023-06-10 ENCOUNTER — Inpatient Hospital Stay: Payer: 59

## 2023-06-10 VITALS — BP 116/76 | HR 81 | Temp 97.2°F | Resp 17

## 2023-06-10 DIAGNOSIS — D500 Iron deficiency anemia secondary to blood loss (chronic): Secondary | ICD-10-CM

## 2023-06-10 DIAGNOSIS — D509 Iron deficiency anemia, unspecified: Secondary | ICD-10-CM | POA: Diagnosis not present

## 2023-06-10 MED ORDER — IRON SUCROSE 20 MG/ML IV SOLN
200.0000 mg | INTRAVENOUS | Status: DC
  Administered 2023-06-10: 200 mg via INTRAVENOUS
  Filled 2023-06-10: qty 10

## 2023-06-10 MED ORDER — SODIUM CHLORIDE FLUSH 0.9 % IV SOLN
10.0000 mL | Freq: Once | INTRAVENOUS | Status: AC
  Administered 2023-06-10: 10 mL via INTRAVENOUS
  Filled 2023-06-10: qty 10

## 2023-06-11 ENCOUNTER — Inpatient Hospital Stay: Payer: 59

## 2023-06-13 ENCOUNTER — Inpatient Hospital Stay: Payer: 59

## 2023-06-20 ENCOUNTER — Inpatient Hospital Stay: Payer: 59

## 2023-06-21 ENCOUNTER — Other Ambulatory Visit: Payer: Self-pay

## 2023-06-21 DIAGNOSIS — R5383 Other fatigue: Secondary | ICD-10-CM | POA: Diagnosis present

## 2023-06-21 DIAGNOSIS — J189 Pneumonia, unspecified organism: Secondary | ICD-10-CM | POA: Diagnosis not present

## 2023-06-21 DIAGNOSIS — J40 Bronchitis, not specified as acute or chronic: Secondary | ICD-10-CM | POA: Insufficient documentation

## 2023-06-21 DIAGNOSIS — R109 Unspecified abdominal pain: Secondary | ICD-10-CM | POA: Diagnosis not present

## 2023-06-21 LAB — CBC
HCT: 39.3 % (ref 36.0–46.0)
Hemoglobin: 13.3 g/dL (ref 12.0–15.0)
MCH: 30.2 pg (ref 26.0–34.0)
MCHC: 33.8 g/dL (ref 30.0–36.0)
MCV: 89.3 fL (ref 80.0–100.0)
Platelets: 274 10*3/uL (ref 150–400)
RBC: 4.4 MIL/uL (ref 3.87–5.11)
RDW: 13.6 % (ref 11.5–15.5)
WBC: 5.5 10*3/uL (ref 4.0–10.5)
nRBC: 0 % (ref 0.0–0.2)

## 2023-06-21 LAB — COMPREHENSIVE METABOLIC PANEL
ALT: 46 U/L — ABNORMAL HIGH (ref 0–44)
AST: 26 U/L (ref 15–41)
Albumin: 4.4 g/dL (ref 3.5–5.0)
Alkaline Phosphatase: 65 U/L (ref 38–126)
Anion gap: 10 (ref 5–15)
BUN: 13 mg/dL (ref 6–20)
CO2: 23 mmol/L (ref 22–32)
Calcium: 9 mg/dL (ref 8.9–10.3)
Chloride: 102 mmol/L (ref 98–111)
Creatinine, Ser: 0.73 mg/dL (ref 0.44–1.00)
GFR, Estimated: >60 mL/min (ref >60)
Glucose, Bld: 103 mg/dL — ABNORMAL HIGH (ref 70–99)
Potassium: 3.3 mmol/L — ABNORMAL LOW (ref 3.5–5.1)
Sodium: 135 mmol/L (ref 135–145)
Total Bilirubin: 0.6 mg/dL (ref <1.2)
Total Protein: 8 g/dL (ref 6.5–8.1)

## 2023-06-21 LAB — LIPASE, BLOOD: Lipase: 26 U/L (ref 11–51)

## 2023-06-21 NOTE — ED Notes (Signed)
Patient to ED waiting room via wheelchair.  Per EMS patient with complaint of headache, vomiting and recent dx'd with pneumonia and started antibiotics.  Reports vomiting started after began antibiotics along with lower abd pain.  EMS intervention -- saline loc to right antecub via 20g angiocath, received zofran 4mg  via iv

## 2023-06-21 NOTE — ED Triage Notes (Signed)
Pt to ed from home via [facility] for headache and emesis since yesterday. Pt is caox4, in no acute distress and in a wheel chair by ems. Pt received some IV zofran.

## 2023-06-22 ENCOUNTER — Emergency Department
Admission: EM | Admit: 2023-06-22 | Discharge: 2023-06-22 | Disposition: A | Discharge status: Home / Self Care | Payer: 59 | Attending: Emergency Medicine | Admitting: Emergency Medicine

## 2023-06-22 ENCOUNTER — Emergency Department: Payer: 59

## 2023-06-22 DIAGNOSIS — J189 Pneumonia, unspecified organism: Secondary | ICD-10-CM

## 2023-06-22 DIAGNOSIS — R519 Headache, unspecified: Secondary | ICD-10-CM

## 2023-06-22 DIAGNOSIS — J40 Bronchitis, not specified as acute or chronic: Secondary | ICD-10-CM

## 2023-06-22 MED ORDER — SODIUM CHLORIDE 0.9 % IV BOLUS
1000.0000 mL | Freq: Once | INTRAVENOUS | Status: AC
Start: 2023-06-22 — End: 2023-06-22
  Administered 2023-06-22: 1000 mL via INTRAVENOUS

## 2023-06-22 MED ORDER — PROCHLORPERAZINE EDISYLATE 10 MG/2ML IJ SOLN
10.0000 mg | Freq: Once | INTRAMUSCULAR | Status: AC
  Administered 2023-06-22: 10 mg via INTRAVENOUS
  Filled 2023-06-22: qty 2

## 2023-06-22 MED ORDER — ONDANSETRON HCL 4 MG PO TABS: 4.0000 mg | ORAL_TABLET | Freq: Every day | ORAL | 1 refills | Status: AC | PRN

## 2023-06-22 MED ORDER — AZITHROMYCIN 250 MG PO TABS: ORAL_TABLET | ORAL | 0 refills | Status: AC

## 2023-06-22 MED ORDER — ACETAMINOPHEN 325 MG PO TABS
650.0000 mg | ORAL_TABLET | Freq: Once | ORAL | Status: AC
  Administered 2023-06-22: 650 mg via ORAL
  Filled 2023-06-22: qty 2

## 2023-06-22 MED ORDER — DIPHENHYDRAMINE HCL 50 MG/ML IJ SOLN
25.0000 mg | Freq: Once | INTRAMUSCULAR | Status: AC
  Administered 2023-06-22: 25 mg via INTRAVENOUS
  Filled 2023-06-22: qty 1

## 2023-06-22 MED ORDER — IOHEXOL 350 MG/ML SOLN
75.0000 mL | Freq: Once | INTRAVENOUS | Status: AC | PRN
  Administered 2023-06-22: 75 mL via INTRAVENOUS

## 2023-06-22 MED ORDER — MAGNESIUM SULFATE 2 GM/50ML IV SOLN
2.0000 g | Freq: Once | INTRAVENOUS | Status: AC
  Administered 2023-06-22: 2 g via INTRAVENOUS
  Filled 2023-06-22: qty 50

## 2023-06-22 NOTE — ED Notes (Signed)
Patient given discharge instructions including prescriptions x2 and importance of follow up with PCP with stated understanding. Int removed, cannula intact, pressure dressing applied. Patient stable and ambulatory with steady even gait on dispo.

## 2023-06-22 NOTE — ED Provider Notes (Signed)
[facility] Provider Note    Event Date/Time   First MD Initiated Contact with Patient 06/22/23 0029     (approximate)   History   Headache (X 1 days)   HPI  Alicia Washington is a 47 y.o. 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    After starting this abx she developed nausea and vomiting abdominal cramping pain.  She has no skin rash, itchiness, new shortness of breath but she has never taken this medication before.  She is most worried about her headache which is progressively gotten worse throughout the week and was especially worse after she vomited several times.  Encompasses her entire head and the back of her neck as well.  No trauma.  Not the worst headache of her life, gradual onset.  Independent Historian contributed to assessment above: Husband at bedside to corroborate information past medical history as above      Physical Exam   Triage Vital Signs: ED Triage Vitals  Encounter Vitals Group     BP 06/21/23 2143 112/85     Systolic BP Percentile --      Diastolic BP Percentile --      Pulse Rate 06/21/23 2143 79     Resp 06/21/23 2143 16     Temp 06/21/23 2143 98.4 F (36.9 C)     Temp Source 06/21/23 2143 Oral     SpO2 06/21/23 2143 100 %     Weight --      Height 06/21/23 2144 5\' 2"  (1.575 m)     Head Circumference --      Peak Flow --      Pain Score 06/21/23 2143 5     Pain Loc --      Pain Education --      Exclude from Growth Chart --     Most recent vital signs: Vitals:   06/22/23 0300 06/22/23 0330  BP: 121/79 124/82  Pulse: 93 84  Resp:  16  Temp:    SpO2: 99% 99%    General: Awake, no distress.  CV:  Good peripheral perfusion.  Resp:  Normal effort.  Abd:  No distention.   Other:  Normal vital signs, nontoxic appearance, no hypoxemia or fever.  Lungs clear to auscultation bilateral without focality or wheezing.  Soft nontender abdominal exam palpation all quadrants.  Neck supple full range of motion no focal neurologic deficits including motor or sensory deficits, facial asymmetry, finger-to-nose, gait.   ED Results / Procedures / Treatments   Labs (all labs ordered are listed, but only abnormal results are displayed) Labs Reviewed  COMPREHENSIVE METABOLIC PANEL - Abnormal; Notable for the following components:      Result Value   Potassium 3.3 (*)    Glucose, Bld 103 (*)    ALT 46 (*)    All other components within normal limits  LIPASE, BLOOD  CBC     I ordered and reviewed the above labs they are notable for cell counts electrolytes largely unremarkable.    RADIOLOGY I independently reviewed and interpreted CT scan of the head and I see no obvious bleeding or midline shift I also reviewed radiologist's formal read.   PROCEDURES:  Critical Care performed: No  Procedures   MEDICATIONS ORDERED IN  ED: Medications  prochlorperazine (COMPAZINE) injection 10 mg (10 mg Intravenous Given 06/22/23 0204)  diphenhydrAMINE (BENADRYL) injection 25 mg (25 mg Intravenous Given 06/22/23 0204)  acetaminophen (TYLENOL) tablet 650 mg (650 mg Oral Given 06/22/23 0204)  magnesium sulfate IVPB 2 g 50 mL (0 g Intravenous Stopped 06/22/23 0343)  sodium chloride 0.9 % bolus 1,000 mL (0 mLs Intravenous Stopped 06/22/23 0343)  iohexol (OMNIPAQUE) 350 MG/ML injection 75 mL (75 mLs Intravenous Contrast Given 06/22/23 0223)    IMPRESSION / MDM / ASSESSMENT AND PLAN / ED COURSE  I reviewed the triage vital signs and the nursing notes.                                Patient's presentation is most consistent with acute presentation with potential threat to life or bodily function.  Differential diagnosis includes, but is not limited to, viral URI, bacterial  pneumonia, atypical pneumonia, migraine headaches, ICH, dissection   The patient is on the cardiac monitor to evaluate for evidence of arrhythmia and/or significant heart rate changes.  MDM:    I think most of her symptoms are related to her viral syndrome URI symptoms earlier this week.   Her nausea and vomiting may be a result of her ongoing illness but may also be an adverse effect of the doxycycline she was prescribed for suspected walking pneumonia.  It does not appear to be an anaphylactic reaction.  Her headache may be also a result of her existing illness.  However with worsening with vomiting, neck pain, obtain CT angiogram of the head neck to rule out dissection or ICH which is fortunately negative.  Per her request I will switch her to azithromycin for potential adverse effect of her doxycycline.  Zofran for nausea prescription as well.  Given unremarkable workup, nontoxic appearance doubt sepsis, and a clear chest x-ray, doubt she is having life-threatening process at this time and plan will be for discharge home with ongoing outpatient management and PMD follow-up.       FINAL CLINICAL IMPRESSION(S) / ED DIAGNOSES   Final diagnoses:  Acute nonintractable headache, unspecified headache type  Bronchitis  Atypical pneumonia     Rx / DC Orders   ED Discharge Orders          Ordered    ondansetron (ZOFRAN) 4 MG tablet  Daily PRN        06/22/23 0346    azithromycin (ZITHROMAX Z-PAK) 250 MG tablet        06/22/23 0346             Note:  This document was prepared using Dragon voice recognition software and may include unintentional dictation errors.    Pilar Jarvis, MD 06/22/23 [id number]

## 2023-06-22 NOTE — Discharge Instructions (Addendum)
Fortunately your head and neck scan did not show any emergency conditions that Your symptoms.  I will switch your doxycycline to something called azithromycin which can treat your suspected pneumonia.  Your chest x-ray did not show any signs of typical bacterial pneumonia and so to treat for suspected walking pneumonia, azithromycin is the right antibiotic.  Take Zofran for nausea as needed. Drink plenty of fluids to stay well-hydrated.  Find Pedialyte or similar electrolyte rehydration formulas at your local pharmacy.  Take acetaminophen 650 mg and ibuprofen 400 mg every 6 hours for pain.  Take with food.  Thank you for choosing [place] for your health care today!  Please see your primary doctor this week for a follow up appointment.   If you have any new, worsening, or unexpected symptoms call your doctor right away or come back to the emergency department for reevaluation.  It was my pleasure to care for you today.   Daneil Dan Modesto Charon, MD

## 2023-06-25 ENCOUNTER — Inpatient Hospital Stay: Payer: 59

## 2023-09-19 ENCOUNTER — Other Ambulatory Visit: Payer: Self-pay | Admitting: Student

## 2023-09-19 DIAGNOSIS — R42 Dizziness and giddiness: Secondary | ICD-10-CM

## 2023-09-19 DIAGNOSIS — R519 Headache, unspecified: Secondary | ICD-10-CM

## 2023-10-01 ENCOUNTER — Inpatient Hospital Stay: Payer: 59 | Attending: Oncology

## 2023-10-01 DIAGNOSIS — D509 Iron deficiency anemia, unspecified: Secondary | ICD-10-CM | POA: Diagnosis present

## 2023-10-01 DIAGNOSIS — E538 Deficiency of other specified B group vitamins: Secondary | ICD-10-CM | POA: Insufficient documentation

## 2023-10-01 LAB — CBC
HCT: 39 % (ref 36.0–46.0)
Hemoglobin: 13.2 g/dL (ref 12.0–15.0)
MCH: 30 pg (ref 26.0–34.0)
MCHC: 33.8 g/dL (ref 30.0–36.0)
MCV: 88.6 fL (ref 80.0–100.0)
Platelets: 231 10*3/uL (ref 150–400)
RBC: 4.4 MIL/uL (ref 3.87–5.11)
RDW: 11.9 % (ref 11.5–15.5)
WBC: 6.2 10*3/uL (ref 4.0–10.5)
nRBC: 0 % (ref 0.0–0.2)

## 2023-10-01 LAB — FERRITIN: Ferritin: 88 ng/mL (ref 11–307)

## 2023-10-01 LAB — IRON AND TIBC
Iron: 59 ug/dL (ref 28–170)
Saturation Ratios: 17 % (ref 10.4–31.8)
TIBC: 343 ug/dL (ref 250–450)
UIBC: 284 ug/dL

## 2023-10-09 ENCOUNTER — Encounter: Payer: Self-pay | Admitting: Urgent Care

## 2023-10-10 ENCOUNTER — Ambulatory Visit
Admission: RE | Admit: 2023-10-10 | Discharge: 2023-10-10 | Disposition: A | Discharge status: Home / Self Care | Payer: 59 | Source: Ambulatory Visit | Attending: Student | Admitting: Student

## 2023-10-10 DIAGNOSIS — R519 Headache, unspecified: Secondary | ICD-10-CM

## 2023-10-10 DIAGNOSIS — R42 Dizziness and giddiness: Secondary | ICD-10-CM

## 2023-10-10 MED ORDER — GADOPICLENOL 0.5 MMOL/ML IV SOLN
6.0000 mL | Freq: Once | INTRAVENOUS | Status: AC | PRN
  Administered 2023-10-10: 6 mL via INTRAVENOUS

## 2024-01-27 ENCOUNTER — Inpatient Hospital Stay: Attending: Oncology

## 2024-01-27 DIAGNOSIS — D509 Iron deficiency anemia, unspecified: Secondary | ICD-10-CM | POA: Insufficient documentation

## 2024-01-27 DIAGNOSIS — E538 Deficiency of other specified B group vitamins: Secondary | ICD-10-CM

## 2024-01-27 LAB — IRON AND TIBC
Iron: 51 ug/dL (ref 28–170)
Saturation Ratios: 15 % (ref 10.4–31.8)
TIBC: 340 ug/dL (ref 250–450)
UIBC: 289 ug/dL

## 2024-01-27 LAB — CBC
HCT: 39.5 % (ref 36.0–46.0)
Hemoglobin: 13.3 g/dL (ref 12.0–15.0)
MCH: 29.8 pg (ref 26.0–34.0)
MCHC: 33.7 g/dL (ref 30.0–36.0)
MCV: 88.4 fL (ref 80.0–100.0)
Platelets: 217 10*3/uL (ref 150–400)
RBC: 4.47 MIL/uL (ref 3.87–5.11)
RDW: 12.3 % (ref 11.5–15.5)
WBC: 3.8 10*3/uL — ABNORMAL LOW (ref 4.0–10.5)
nRBC: 0 % (ref 0.0–0.2)

## 2024-01-27 LAB — FERRITIN: Ferritin: 33 ng/mL (ref 11–307)

## 2024-01-28 ENCOUNTER — Inpatient Hospital Stay: Payer: 59 | Admitting: Oncology

## 2024-01-28 ENCOUNTER — Other Ambulatory Visit: Payer: 59

## 2024-01-28 VITALS — BP 110/62 | HR 82 | Temp 96.0°F | Resp 18 | Ht 62.0 in | Wt 134.6 lb

## 2024-01-28 DIAGNOSIS — D509 Iron deficiency anemia, unspecified: Secondary | ICD-10-CM | POA: Diagnosis not present

## 2024-01-31 ENCOUNTER — Encounter: Payer: Self-pay | Admitting: Urgent Care

## 2024-01-31 NOTE — Progress Notes (Signed)
 Hematology/Oncology Consult note [facility]  [phone] [phone]  Patient Care Team: Verdon Keen, MD as PCP - General (Obstetrics and Gynecology) [name] (Gastroenterology) Aundria Ladell POUR, MD as Consulting Physician (Gastroenterology) Melanee Annah BROCKS, MD as Consulting Physician (Oncology)   Name of the patient: Alicia Washington  [id number]  1976-03-27   Date of visit: 01/31/24  Diagnosis- iron  deficiency anemia  Chief complaint/ Reason for visit- routine f/u of iron  deficiency anemia  Heme/Onc history:  Patient is a 48 year old female with history of iron  deficiency of unclear etiology.  She has received IV iron  in the past.  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  Colonoscopy on 05/11/2018 was normal.  Random biopsies revealed no abnormalities.  Capsule enteroscopy on 06/15/2018 was normal.  Urinalysis on 05/17/2018 and 12/15/2018 revealed no hematuria.    Interval history- she has been seeing neurology for dizziness and nausea. Reports ongoing fatigue  ECOG PS- 0   Review of systems- Review of Systems  Constitutional:  Positive for malaise/fatigue. Negative for chills, fever and weight loss.  HENT:  Negative for congestion, ear discharge and nosebleeds.   Eyes:  Negative for blurred vision.  Respiratory:  Negative for cough, hemoptysis, sputum production, shortness of breath and wheezing.   Cardiovascular:  Negative for chest pain, palpitations, orthopnea and claudication.  Gastrointestinal:  Positive for nausea. Negative for abdominal pain, blood in stool, constipation, diarrhea, heartburn, melena and vomiting.  Genitourinary:  Negative for dysuria, flank pain, frequency, hematuria and urgency.  Musculoskeletal:  Negative for back pain, joint pain and  myalgias.  Skin:  Negative for rash.  Neurological:  Positive for dizziness. Negative for tingling, focal weakness, seizures, weakness and headaches.  Endo/Heme/Allergies:  Does not bruise/bleed easily.  Psychiatric/Behavioral:  Negative for depression and suicidal ideas. The patient does not have insomnia.       No Known Allergies   Past Medical History:  Diagnosis Date   Cancer (HCC)    skin   Fibromyalgia    Low iron       Past Surgical History:  Procedure Laterality Date   BREAST BIOPSY Left    core- neg   BREAST BIOPSY Right 03/07/2022   stereo bx, calcs X clip-path pending   CESAREAN SECTION     ENDOMETRIAL ABLATION      Social History   Socioeconomic History   Marital status: Married    Spouse name: Not on file   Number of children: Not on file   Years of education: Not on file   Highest education level: Not on file  Occupational History   Not on file  Tobacco Use   Smoking status: Never   Smokeless tobacco: Never  Vaping Use   Vaping status: Never Used  Substance and Sexual Activity   Alcohol use: No   Drug use: Never   Sexual activity: Not on file  Other Topics Concern   Not on file  Social History Narrative   Not on file   Social Drivers of Health   Financial Resource Strain: Low Risk  (11/17/2023)   Received from [facility]   Overall Financial Resource Strain (CARDIA)    Difficulty of Paying Living Expenses: Not hard at all  Food Insecurity: No Food Insecurity (11/17/2023)   Received from [facility]   Hunger Vital Sign  Within the past 12 months, you worried that your food would run out before you got the money to buy more.: Never true    Within the past 12 months, the food you bought just didn't last and you didn't have money to get more.: Never true  Transportation Needs: No Transportation Needs (11/17/2023)   Received from [facility] - Transportation    In the past 12  months, has lack of transportation kept you from medical appointments or from getting medications?: No    Lack of Transportation (Non-Medical): No  Physical Activity: Not on file  Stress: Not on file  Social Connections: Not on file  Intimate Partner Violence: Not on file    Family History  Problem Relation Age of Onset   Cancer Mother    Cancer Maternal Grandfather    Breast cancer Neg Hx      Current Outpatient Medications:    cyanocobalamin  (VITAMIN B12) 1000 MCG/ML injection, INJECT CONTENT OF 1 VIAL ONCE A MONTH, Disp: 1 mL, Rfl: 11   ondansetron  (ZOFRAN ) 4 MG tablet, Take 1 tablet (4 mg total) by mouth daily as needed for nausea or vomiting., Disp: 30 tablet, Rfl: 1   Syringe/Needle, Disp, (SYRINGE 3CC/25GX1) 25G X 1 3 ML MISC, 3 mLs by Does not apply route every 30 (thirty) days., Disp: 12 each, Rfl: 0   topiramate (TOPAMAX) 25 MG tablet, Take 25 mg by mouth at bedtime., Disp: , Rfl:    Vitamin D, Ergocalciferol, (DRISDOL) 1.25 MG (50000 UNIT) CAPS capsule, Take 50,000 Units by mouth once a week., Disp: , Rfl:    Cyanocobalamin  1000 MCG/ML KIT, Inject 1,000 mcg as directed every 30 (thirty) days. (Patient not taking: Reported on 11/20/2022), Disp: 1 kit, Rfl: 11   IRON  SUCROSE IV, Inject into the vein every 30 (thirty) days. (Patient not taking: Reported on 11/20/2022), Disp: , Rfl:    sertraline (ZOLOFT) 100 MG tablet, Take 100 mg by mouth daily. , Disp: , Rfl:    sertraline (ZOLOFT) 100 MG tablet, Take 1 tablet by mouth daily. (Patient not taking: Reported on 05/28/2023), Disp: , Rfl:    SUMAtriptan (IMITREX) 100 MG tablet, Take by mouth. (Patient not taking: Reported on 01/28/2024), Disp: , Rfl:  No current facility-administered medications for this visit.  Facility-Administered Medications Ordered in Other Visits:    sodium chloride  flush (NS) 0.9 % injection 10 mL, 10 mL, Intracatheter, Once PRN, Gray, Bryan E, NP   sodium chloride  flush (NS) 0.9 % injection 3 mL, 3 mL,  Intracatheter, Once PRN, Elnor Dorise BRAVO, NP  Physical exam:  Vitals:   01/28/24 1526  BP: 110/62  Pulse: 82  Resp: 18  Temp: (!) 96 F (35.6 C)  TempSrc: Tympanic  SpO2: 100%  Weight: 134 lb 9.6 oz (61.1 kg)  Height: 5' 2 (1.575 m)   Physical Exam  Cardiovascular:     Rate and Rhythm: Normal rate and regular rhythm.     Heart sounds: Normal heart sounds.  Pulmonary:     Effort: Pulmonary effort is normal.     Breath sounds: Normal breath sounds.  Abdominal:     General: Bowel sounds are normal.   Skin:    General: Skin is warm and dry.   Neurological:     Mental Status: She is oriented to person, place, and time.      I have personally reviewed labs listed below:    Latest Ref Rng & Units 06/21/2023  9:48 PM  CMP  Glucose 70 - 99 mg/dL 896   BUN 6 - 20 mg/dL 13   Creatinine 9.55 - 1.00 mg/dL 9.26   Sodium 864 - 854 mmol/L 135   Potassium 3.5 - 5.1 mmol/L 3.3   Chloride 98 - 111 mmol/L 102   CO2 22 - 32 mmol/L 23   Calcium 8.9 - 10.3 mg/dL 9.0   Total Protein 6.5 - 8.1 g/dL 8.0   Total Bilirubin <8.7 mg/dL 0.6   Alkaline Phos 38 - 126 U/L 65   AST 15 - 41 U/L 26   ALT 0 - 44 U/L 46       Latest Ref Rng & Units 01/27/2024   10:20 AM  CBC  WBC 4.0 - 10.5 K/uL 3.8   Hemoglobin 12.0 - 15.0 g/dL 86.6   Hematocrit 63.9 - 46.0 % 39.5   Platelets 150 - 400 K/uL 217     Assessment and plan- Patient is a 48 y.o. female here for routine follow up for iron  deficiency anemia  Patient is not presently anemic with an H&H of 13.3/29.5.  Ferritin levels however are low at 33 with an iron  saturation of 15%.  Discussed risks and benefits of IV iron  including all but not limited to possible risk of infusion anaphylactic reaction.  Patient understands and agrees to proceed as planned.  CBC ferritin and iron  studies in 3 months in 6 months and I will see her back in 6 months  WBC is mildly low at 3.8. she has not had low wbc before. Neutrophil re normal. Continue to  monitor   Visit Diagnosis 1. Iron  deficiency anemia, unspecified iron  deficiency anemia type      Dr. Annah Skene, MD, MPH [facility] [phone] 01/31/2024 7:04 PM

## 2024-02-23 ENCOUNTER — Inpatient Hospital Stay: Attending: Oncology

## 2024-02-23 VITALS — BP 112/73 | HR 75 | Temp 97.2°F | Resp 18

## 2024-02-23 DIAGNOSIS — D500 Iron deficiency anemia secondary to blood loss (chronic): Secondary | ICD-10-CM

## 2024-02-23 DIAGNOSIS — D509 Iron deficiency anemia, unspecified: Secondary | ICD-10-CM | POA: Diagnosis present

## 2024-02-23 MED ORDER — IRON SUCROSE 20 MG/ML IV SOLN
200.0000 mg | INTRAVENOUS | Status: DC
Start: 2024-02-23 — End: 2024-02-23
  Administered 2024-02-23: 200 mg via INTRAVENOUS
  Filled 2024-02-23: qty 10

## 2024-03-01 ENCOUNTER — Other Ambulatory Visit: Payer: Self-pay | Admitting: Nurse Practitioner

## 2024-03-01 ENCOUNTER — Inpatient Hospital Stay

## 2024-03-01 VITALS — BP 116/67 | HR 80 | Temp 98.0°F | Resp 16

## 2024-03-01 DIAGNOSIS — D509 Iron deficiency anemia, unspecified: Secondary | ICD-10-CM | POA: Diagnosis not present

## 2024-03-01 DIAGNOSIS — D500 Iron deficiency anemia secondary to blood loss (chronic): Secondary | ICD-10-CM

## 2024-03-01 MED ORDER — IRON SUCROSE 20 MG/ML IV SOLN
200.0000 mg | Freq: Once | INTRAVENOUS | Status: AC
  Administered 2024-03-01: 200 mg via INTRAVENOUS
  Filled 2024-03-01: qty 10

## 2024-03-08 ENCOUNTER — Inpatient Hospital Stay: Attending: Oncology

## 2024-03-08 VITALS — BP 117/85 | HR 89 | Temp 97.0°F | Resp 17

## 2024-03-08 DIAGNOSIS — D509 Iron deficiency anemia, unspecified: Secondary | ICD-10-CM | POA: Insufficient documentation

## 2024-03-08 DIAGNOSIS — D500 Iron deficiency anemia secondary to blood loss (chronic): Secondary | ICD-10-CM

## 2024-03-08 MED ORDER — IRON SUCROSE 20 MG/ML IV SOLN
200.0000 mg | Freq: Once | INTRAVENOUS | Status: AC
  Administered 2024-03-08: 200 mg via INTRAVENOUS
  Filled 2024-03-08: qty 10

## 2024-03-08 MED ORDER — SODIUM CHLORIDE 0.9% FLUSH
10.0000 mL | Freq: Once | INTRAVENOUS | Status: AC
  Administered 2024-03-08: 10 mL via INTRAVENOUS
  Filled 2024-03-08: qty 10

## 2024-03-08 NOTE — Patient Instructions (Signed)

## 2024-03-15 ENCOUNTER — Inpatient Hospital Stay

## 2024-03-15 VITALS — BP 111/73 | HR 76 | Temp 97.9°F | Resp 16

## 2024-03-15 DIAGNOSIS — D500 Iron deficiency anemia secondary to blood loss (chronic): Secondary | ICD-10-CM

## 2024-03-15 DIAGNOSIS — D509 Iron deficiency anemia, unspecified: Secondary | ICD-10-CM | POA: Diagnosis not present

## 2024-03-15 MED ORDER — IRON SUCROSE 20 MG/ML IV SOLN
200.0000 mg | Freq: Once | INTRAVENOUS | Status: AC
  Administered 2024-03-15 (×2): 200 mg via INTRAVENOUS
  Filled 2024-03-15: qty 10

## 2024-03-22 ENCOUNTER — Inpatient Hospital Stay

## 2024-03-22 VITALS — BP 115/85 | HR 77 | Temp 96.8°F | Resp 16

## 2024-03-22 DIAGNOSIS — D500 Iron deficiency anemia secondary to blood loss (chronic): Secondary | ICD-10-CM

## 2024-03-22 DIAGNOSIS — D509 Iron deficiency anemia, unspecified: Secondary | ICD-10-CM | POA: Diagnosis not present

## 2024-03-22 MED ORDER — IRON SUCROSE 20 MG/ML IV SOLN
200.0000 mg | Freq: Once | INTRAVENOUS | Status: AC
  Administered 2024-03-22: 200 mg via INTRAVENOUS
  Filled 2024-03-22: qty 10

## 2024-04-28 ENCOUNTER — Other Ambulatory Visit: Payer: Self-pay

## 2024-04-28 DIAGNOSIS — D500 Iron deficiency anemia secondary to blood loss (chronic): Secondary | ICD-10-CM

## 2024-04-29 ENCOUNTER — Inpatient Hospital Stay: Attending: Oncology

## 2024-04-29 DIAGNOSIS — D500 Iron deficiency anemia secondary to blood loss (chronic): Secondary | ICD-10-CM

## 2024-04-29 DIAGNOSIS — D509 Iron deficiency anemia, unspecified: Secondary | ICD-10-CM | POA: Diagnosis present

## 2024-04-29 LAB — CBC WITH DIFFERENTIAL (CANCER CENTER ONLY)
Abs Immature Granulocytes: 0.06 K/uL (ref 0.00–0.07)
Basophils Absolute: 0 K/uL (ref 0.0–0.1)
Basophils Relative: 1 %
Eosinophils Absolute: 0.1 K/uL (ref 0.0–0.5)
Eosinophils Relative: 2 %
HCT: 40.4 % (ref 36.0–46.0)
Hemoglobin: 13.5 g/dL (ref 12.0–15.0)
Immature Granulocytes: 1 %
Lymphocytes Relative: 29 %
Lymphs Abs: 1.8 K/uL (ref 0.7–4.0)
MCH: 30.1 pg (ref 26.0–34.0)
MCHC: 33.4 g/dL (ref 30.0–36.0)
MCV: 90.2 fL (ref 80.0–100.0)
Monocytes Absolute: 0.5 K/uL (ref 0.1–1.0)
Monocytes Relative: 8 %
Neutro Abs: 3.7 K/uL (ref 1.7–7.7)
Neutrophils Relative %: 59 %
Platelet Count: 261 K/uL (ref 150–400)
RBC: 4.48 MIL/uL (ref 3.87–5.11)
RDW: 14 % (ref 11.5–15.5)
WBC Count: 6.2 K/uL (ref 4.0–10.5)
nRBC: 0 % (ref 0.0–0.2)

## 2024-04-29 LAB — FERRITIN: Ferritin: 274 ng/mL (ref 11–307)

## 2024-04-29 LAB — IRON AND TIBC
Iron: 77 ug/dL (ref 28–170)
Saturation Ratios: 24 % (ref 10.4–31.8)
TIBC: 323 ug/dL (ref 250–450)
UIBC: 246 ug/dL

## 2024-05-11 NOTE — Progress Notes (Signed)
 Gynecology Annual Exam   PCP: Trudy Carrier  Chief Complaint:  Chief Complaint  Patient presents with   Annual Exam    History of Present Illness:  Ms. Alicia Washington is a 48 y.o. H7E7997 presents today for her annual examination.   Patient Concerns: - No concerns today  Pertinent Hx: - Hx of uterine ablation - IDA  Her LMP was No LMP recorded. Patient has had an ablation. Her menses are absent due to uterine ablation.   She does have vasomotor sx. Reports night sweats and brain fog. She is able to get sleep at night. Her symptoms are not bothersome.  She is single partner, contraception - vasectomy. She does not have vaginal dryness. Denies urinary symptoms, including incontinence, frequency or urgency.  Screening: - Last Pap: 10/09/2022 Results were: no abnormalities /neg HPV DNA.  - Last mammogram: 03/25/2023.  Results were: normal--routine follow-up in 12 months  - Density: D - Extremely dense - Family hx of breast cancer: denies. She is aware of how her breast look and feel. Denies any breast concerns. - Family hx of ovarian cancer: denies - Family hx of colon cancer: denies - Colon Cancer Screening: Colonoscopy completed on 05/11/2018. Repeat 10 years, due 05/2028.  Social Hx: Marital Status: married. Teaches at [employer] Tobacco use: The patient denies current or previous tobacco use. Alcohol use: none  She does not get adequate calcium and Vitamin D in her diet.  The patient wears seatbelts: yes. The patient reports that domestic violence in her life is absent.   Past Medical History:  Diagnosis Date   Anemia 2007   Anxiety 2001   Breast mass 2023   Depression    History of blood transfusion 2019   Pleurisy 11/2017   Shingles    Stomach ulcer     Past Surgical History:  Procedure Laterality Date   EGD  05/11/2018   Gastritis/No Repeat/TKT   COLONOSCOPY  05/11/2018   Negative colon biopsies/Repeat at age 47 in 71yrs/TKT    BREAST BIOPSY  2023   BREAST SURGERY Right    Biopsy - benign   CESAREAN SECTION     x2   ENDOMETRIAL ABLATION      Prior to Admission medications  Medication Sig Taking? Last Dose  ondansetron  (ZOFRAN ) 4 MG tablet Take 4 mg by mouth every 8 (eight) hours as needed for Nausea Yes Taking  sertraline (ZOLOFT) 100 MG tablet Take 1 tablet (100 mg total) by mouth once daily Yes   SUMAtriptan (IMITREX) 100 MG tablet Take 1 tablet (100 mg total) by mouth as directed for Migraine May take a second dose after 2 hours if needed. Yes PRN Not Currently Taking  topiramate (TOPAMAX) 25 MG tablet Take 1 tablet (25 mg total) by mouth at bedtime Yes Taking    No Known Allergies  Gynecologic History: No LMP recorded. Patient has had an ablation. Contraception: uterine ablation Pap Hx:  History of STI: no  Obstetric History: G2P2002  Family History  Problem Relation Name Age of Onset   Pleurisy Mother Mellany Ray    Other Mother Mellany Ray    Thyroid  disease Mother Mellany Ray        Hashimotos Disease   High blood pressure (Hypertension) Mother Mellany Ray    Cancer Mother Mellany Ray        Kidney tumors   Asthma Brother Adam    Depression Brother Juliene Dade    Coronary Artery Disease (Blocked arteries around heart) Father Lamar Dade  High blood pressure (Hypertension) Father Lamar Dade     Social History   Socioeconomic History   Marital status: Married  Tobacco Use   Smoking status: Never   Smokeless tobacco: Never  Vaping Use   Vaping status: Never Used  Substance and Sexual Activity   Alcohol use: No   Drug use: No   Sexual activity: Yes    Partners: Male    Birth control/protection: Surgical    Comment: Endometrial ablasion 2008   Social Drivers of Health   Financial Resource Strain: Low Risk  (05/11/2024)   Overall Financial Resource Strain (CARDIA)    Difficulty of Paying Living Expenses: Not hard at all  Food Insecurity: No Food Insecurity  (05/11/2024)   Hunger Vital Sign    Worried About Running Out of Food in the Last Year: Never true    Ran Out of Food in the Last Year: Never true  Transportation Needs: No Transportation Needs (05/11/2024)   PRAPARE - Administrator, Civil Service (Medical): No    Lack of Transportation (Non-Medical): No  Housing Stability: Low Risk  (05/11/2024)   Housing Stability Vital Sign    Unable to Pay for Housing in the Last Year: No    Number of Times Moved in the Last Year: 0    Homeless in the Last Year: No    ROS: see HPI for pertinent positives and negatives, otherwise a 10 system review is negative.    Specifically, she denies problems with period, abdominal pain, pelvic pain, vaginal discharge. Denies bowel problems, bladder problems, incontinence, depression or breast lumps or masses.   Physical Exam BP 119/79   Pulse 78   Ht 157.5 cm (5' 2)   Wt 62.3 kg (137 lb 6.4 oz)   BMI 25.13 kg/m    Physical Exam Genitourinary:     Genitourinary Comments: Deferred pelvic exam  Breasts:    Tanner Score is 5.     Right: Normal. No mass, nipple discharge, skin change or tenderness.     Left: Normal. No mass, nipple discharge, skin change or tenderness.  HENT:     Head: Normocephalic.     Nose: Nose normal.  Eyes:     Pupils: Pupils are equal, round, and reactive to light.  Cardiovascular:     Rate and Rhythm: Normal rate and regular rhythm.     Heart sounds: Normal heart sounds.  Pulmonary:     Effort: Pulmonary effort is normal.     Breath sounds: Normal breath sounds.  Abdominal:     General: Bowel sounds are normal.     Palpations: Abdomen is soft.     Tenderness: There is no abdominal tenderness.  Musculoskeletal:        General: Normal range of motion.     Cervical back: Normal range of motion.  Neurological:     Mental Status: She is alert and oriented to person, place, and time.  Skin:    General: Skin is warm and dry.     Capillary Refill:  Capillary refill takes less than 2 seconds.  Psychiatric:        Mood and Affect: Mood normal.        Behavior: Behavior normal.        Thought Content: Thought content normal.        Judgment: Judgment normal.  Exam conducted with a chaperone present.     Results: PHQ-9: 6  Assessment: 48 y.o. H7E7997 here for routine gynecologic annual examination  Plan: Problem List Items Addressed This Visit     Generalized anxiety disorder   Relevant Medications   sertraline (ZOLOFT) 100 MG tablet   Other Visit Diagnoses       Encounter for gynecological examination    -  Primary     Encounter for screening mammogram for malignant neoplasm of breast       Relevant Orders   Mammo screening digital bilateral     Depression screening (Z13.31)       Relevant Orders   Depression Screen -(PHQ- 2/9, BDI) (Completed)       Screening: -- Blood pressure screen normal -- Colon Cancer Screening - not due  -- Mammogram - due. Patient to call [place] to arrange. She understands that it is her responsibility to arrange this. -- Weight screening: normal -- Depression screening negative (PHQ-9)  -- Nutrition: normal -- Cholesterol screening: per PCP -- Osteoporosis screening: not due -- Tobacco screening: not using -- Alcohol screening: AUDIT questionnaire indicates low-risk usage. -- Family history of breast cancer screening: done. not at high risk. -- No evidence of domestic violence or intimate partner violence. -- STD screening: gonorrhea/chlamydia NAAT not collected per patient request. -- Pap smear not collected per ASCCP guidelines -- Flu vaccine: Has not received, declines today. -- HPV vaccination series: not eligilbe  I emphasized the importance of an annual pelvic and breast exam, though as always these exams are her choice.  I have discussed the importance of breast self-awareness, weight bearing exercise and healthy diet as well as adequate intake of dietary calcium from  (1000mg ) and vitamin D (400IU).    Generalized anxiety disorder/Depression Refill sent to pharmacy.  -     sertraline (ZOLOFT) 100 MG tablet; Take 1 tablet (100 mg total) by mouth once daily   Return in about 1 year (around 05/11/2025) for [facility] Visit.   Attestation Statement:   I personally performed the service, non-incident to. (WP)   KATHERINE PHILLIPS, NP [facility] 05/11/2024 3:43 PM

## 2024-08-06 ENCOUNTER — Other Ambulatory Visit: Payer: Self-pay

## 2024-08-06 DIAGNOSIS — D500 Iron deficiency anemia secondary to blood loss (chronic): Secondary | ICD-10-CM

## 2024-08-09 ENCOUNTER — Ambulatory Visit: Admitting: Oncology

## 2024-08-09 ENCOUNTER — Inpatient Hospital Stay

## 2024-08-09 ENCOUNTER — Other Ambulatory Visit

## 2024-08-10 ENCOUNTER — Ambulatory Visit: Admitting: Oncology

## 2024-08-16 ENCOUNTER — Inpatient Hospital Stay: Attending: Oncology

## 2024-08-16 DIAGNOSIS — D500 Iron deficiency anemia secondary to blood loss (chronic): Secondary | ICD-10-CM

## 2024-08-16 LAB — CBC WITH DIFFERENTIAL (CANCER CENTER ONLY)
Abs Immature Granulocytes: 0.07 K/uL (ref 0.00–0.07)
Basophils Absolute: 0 K/uL (ref 0.0–0.1)
Basophils Relative: 1 %
Eosinophils Absolute: 0.1 K/uL (ref 0.0–0.5)
Eosinophils Relative: 2 %
HCT: 41.6 % (ref 36.0–46.0)
Hemoglobin: 14 g/dL (ref 12.0–15.0)
Immature Granulocytes: 1 %
Lymphocytes Relative: 24 %
Lymphs Abs: 1.7 K/uL (ref 0.7–4.0)
MCH: 30.5 pg (ref 26.0–34.0)
MCHC: 33.7 g/dL (ref 30.0–36.0)
MCV: 90.6 fL (ref 80.0–100.0)
Monocytes Absolute: 0.6 K/uL (ref 0.1–1.0)
Monocytes Relative: 9 %
Neutro Abs: 4.5 K/uL (ref 1.7–7.7)
Neutrophils Relative %: 63 %
Platelet Count: 267 K/uL (ref 150–400)
RBC: 4.59 MIL/uL (ref 3.87–5.11)
RDW: 12.3 % (ref 11.5–15.5)
WBC Count: 7 K/uL (ref 4.0–10.5)
nRBC: 0 % (ref 0.0–0.2)

## 2024-08-16 LAB — IRON AND TIBC
Iron: 86 ug/dL (ref 28–170)
Saturation Ratios: 28 % (ref 10.4–31.8)
TIBC: 312 ug/dL (ref 250–450)
UIBC: 226 ug/dL

## 2024-08-16 LAB — FERRITIN: Ferritin: 345 ng/mL — ABNORMAL HIGH (ref 11–307)

## 2024-08-17 ENCOUNTER — Encounter: Payer: Self-pay | Admitting: Oncology

## 2024-08-17 ENCOUNTER — Inpatient Hospital Stay: Admitting: Oncology

## 2024-08-17 VITALS — BP 121/88 | HR 79 | Temp 97.3°F | Resp 18 | Ht 62.0 in | Wt 140.6 lb

## 2024-08-17 DIAGNOSIS — D509 Iron deficiency anemia, unspecified: Secondary | ICD-10-CM | POA: Diagnosis not present

## 2024-08-17 NOTE — Progress Notes (Signed)
 "    Hematology/Oncology Consult note [facility]  [phone] [phone]  Patient Care Team: Verdon Keen, MD as PCP - General (Obstetrics and Gynecology) Alicia Washington (Gastroenterology) Aundria Ladell POUR, MD as Consulting Physician (Gastroenterology) Melanee Annah BROCKS, MD as Consulting Physician (Oncology)   Name of the patient: Alicia Washington  [id number]  1975/10/21   Date of visit: 08/17/2024  Diagnosis-iron  deficiency anemia  Chief complaint/ Reason for visit-routine follow-up of iron  deficiency anemia  Heme/Onc history: Patient is a 49 year old female with history of iron  deficiency of unclear etiology.  She has received IV iron  in the past.  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  Colonoscopy on 05/11/2018 was normal.  Random biopsies revealed no abnormalities.  Capsule enteroscopy on 06/15/2018 was normal.  Urinalysis on 05/17/2018 and 12/15/2018 revealed no hematuria.      Interval history- LASHAI Washington is a 49 year old female with iron  deficiency anemia who presents for hematology follow-up and review of recent laboratory results.  She has required intermittent iron  supplementation, including one infusion in 2024, one in 2025, and two in 2023.  Her most recent laboratory studies showed a hemoglobin of 14 and a ferritin of 345.  She underwent endometrial ablation eighteen years ago and has been amenorrheic since. She denies hematochezia or hematuria.       ECOG PS- 0 Pain scale- 0   Review of systems- Review of Systems  Constitutional:  Negative for chills, fever, malaise/fatigue and weight loss.  HENT:  Negative for congestion, ear discharge and nosebleeds.   Eyes:  Negative for blurred vision.  Respiratory:  Negative for cough, hemoptysis, sputum production,  shortness of breath and wheezing.   Cardiovascular:  Negative for chest pain, palpitations, orthopnea and claudication.  Gastrointestinal:  Negative for abdominal pain, blood in stool, constipation, diarrhea, heartburn, melena, nausea and vomiting.  Genitourinary:  Negative for dysuria, flank pain, frequency, hematuria and urgency.  Musculoskeletal:  Negative for back pain, joint pain and myalgias.  Skin:  Negative for rash.  Neurological:  Negative for dizziness, tingling, focal weakness, seizures, weakness and headaches.  Endo/Heme/Allergies:  Does not bruise/bleed easily.  Psychiatric/Behavioral:  Negative for depression and suicidal ideas. The patient does not have insomnia.       Allergies[1]   Past Medical History:  Diagnosis Date   Cancer (HCC)    skin   Fibromyalgia    Low iron       Past Surgical History:  Procedure Laterality Date   BREAST BIOPSY Left    core- neg   BREAST BIOPSY Right 03/07/2022   stereo bx, calcs X clip-path pending   CESAREAN SECTION     ENDOMETRIAL ABLATION      Social History   Socioeconomic History   Marital status: Married    Spouse name: Not on file   Number of children: Not on file   Years of education: Not on file   Highest education level: Not on file  Occupational History   Not on file  Tobacco Use   Smoking status: Never   Smokeless tobacco: Never  Vaping Use   Vaping status: Never Used  Substance and Sexual Activity   Alcohol use: No   Drug use: Never   Sexual activity: Not on file  Other Topics Concern   Not on file  Social History Narrative   Not on  file   Social Drivers of Health   Tobacco Use: Low Risk (08/17/2024)   Patient History    Smoking Tobacco Use: Never    Smokeless Tobacco Use: Never    Passive Exposure: Not on file  Financial Resource Strain: Low Risk  (05/11/2024)   Received from [facility]   Overall Financial Resource Strain (CARDIA)    Difficulty of Paying Living Expenses:  Not hard at all  Food Insecurity: No Food Insecurity (05/11/2024)   Received from [facility]   Epic    Within the past 12 months, you worried that your food would run out before you got the money to buy more.: Never true    Within the past 12 months, the food you bought just didn't last and you didn't have money to get more.: Never true  Transportation Needs: No Transportation Needs (05/11/2024)   Received from [facility] - Transportation    In the past 12 months, has lack of transportation kept you from medical appointments or from getting medications?: No    Lack of Transportation (Non-Medical): No  Physical Activity: Not on file  Stress: Not on file  Social Connections: Not on file  Intimate Partner Violence: Not on file  Depression (PHQ2-9): Low Risk (08/17/2024)   Depression (PHQ2-9)    PHQ-2 Score: 0  Alcohol Screen: Not on file  Housing: Low Risk  (05/11/2024)   Received from [facility]   Epic    In the last 12 months, was there a time when you were not able to pay the mortgage or rent on time?: No    In the past 12 months, how many times have you moved where you were living?: 0    At any time in the past 12 months, were you homeless or living in a shelter (including now)?: No  Utilities: Not At Risk (05/11/2024)   Received from [facility]   Epic    In the past 12 months has the electric, gas, oil, or water company threatened to shut off services in your home?: No  Health Literacy: Not on file    Family History  Problem Relation Age of Onset   Cancer Mother    Cancer Maternal Grandfather    Breast cancer Neg Hx     Current Medications[2]  Physical exam:  Vitals:   08/17/24 1126  BP: 121/88  Pulse: 79  Resp: 18  Temp: (!) 97.3 F (36.3 C)  TempSrc: Tympanic  SpO2: 100%  Weight: 140 lb 9.6 oz (63.8 kg)  Height: 5' 2 (1.575 m)   Physical Exam Cardiovascular:     Rate and  Rhythm: Normal rate and regular rhythm.     Heart sounds: Normal heart sounds.  Pulmonary:     Effort: Pulmonary effort is normal.     Breath sounds: Normal breath sounds.  Skin:    General: Skin is warm and dry.  Neurological:     Mental Status: She is alert and oriented to person, place, and time.      I have personally reviewed labs listed below:    Latest Ref Rng & Units 06/21/2023    9:48 PM  CMP  Glucose 70 - 99 mg/dL 896   BUN 6 - 20 mg/dL 13   Creatinine 9.55 - 1.00 mg/dL 9.26   Sodium 864 - 854 mmol/L 135   Potassium 3.5 - 5.1 mmol/L 3.3   Chloride 98 -  111 mmol/L 102   CO2 22 - 32 mmol/L 23   Calcium 8.9 - 10.3 mg/dL 9.0   Total Protein 6.5 - 8.1 g/dL 8.0   Total Bilirubin <8.7 mg/dL 0.6   Alkaline Phos 38 - 126 U/L 65   AST 15 - 41 U/L 26   ALT 0 - 44 U/L 46       Latest Ref Rng & Units 08/16/2024    4:02 PM  CBC  WBC 4.0 - 10.5 K/uL 7.0   Hemoglobin 12.0 - 15.0 g/dL 85.9   Hematocrit 63.9 - 46.0 % 41.6   Platelets 150 - 400 K/uL 267      Assessment and plan- Patient is a 49 y.o. female here for a routine follow-up of iron  deficiency anemia  Assessment and Plan    Iron  deficiency anemia Iron  deficiency anemia managed with intermittent iron  supplementation. Normal hemoglobin and ferritin levels. No active bleeding source identified. - Advised no current need for iron  supplementation. - Discussed ferritin elevation due to stress, inflammation, or recent infection is not concerning. - Ordered repeat iron  studies in six months. - Scheduled follow-up in one year. - Instructed to contact clinic if symptoms develop or for earlier iron  studies.         Visit Diagnosis 1. Iron  deficiency anemia, unspecified iron  deficiency anemia type      Dr. Annah Skene, MD, MPH [facility] [phone] 08/17/2024 12:20 PM                   [1] No Known Allergies [2]  Current Outpatient Medications:    Acetaminophen  500  MG capsule, Take 500 mg by mouth., Disp: , Rfl:    cyanocobalamin  (VITAMIN B12) 1000 MCG/ML injection, INJECT CONTENT OF 1 VIAL ONCE A MONTH, Disp: 1 mL, Rfl: 11   rizatriptan (MAXALT) 10 MG tablet, Take 10 mg by mouth as needed for migraine. May repeat in 2 hours if needed, Disp: , Rfl:    sertraline (ZOLOFT) 100 MG tablet, Take 100 mg by mouth daily. , Disp: , Rfl:    topiramate (TOPAMAX) 25 MG tablet, Take 25 mg by mouth at bedtime., Disp: , Rfl:    Cyanocobalamin  1000 MCG/ML KIT, Inject 1,000 mcg as directed every 30 (thirty) days. (Patient not taking: Reported on 11/20/2022), Disp: 1 kit, Rfl: 11   IRON  SUCROSE IV, Inject into the vein every 30 (thirty) days. (Patient not taking: Reported on 11/20/2022), Disp: , Rfl:    sertraline (ZOLOFT) 100 MG tablet, Take 1 tablet by mouth daily. (Patient not taking: Reported on 05/28/2023), Disp: , Rfl:    Syringe/Needle, Disp, (SYRINGE 3CC/25GX1) 25G X 1 3 ML MISC, 3 mLs by Does not apply route every 30 (thirty) days., Disp: 12 each, Rfl: 0   Vitamin D, Ergocalciferol, (DRISDOL) 1.25 MG (50000 UNIT) CAPS capsule, Take 50,000 Units by mouth once a week. (Patient not taking: Reported on 08/17/2024), Disp: , Rfl:   "

## 2024-08-17 NOTE — Progress Notes (Signed)
 Patient doing good; no new or acute concerns at this time.

## 2024-08-17 NOTE — Addendum Note (Signed)
 Addended by: FAUSTINO BENCE on: 08/17/2024 01:01 PM   Modules accepted: Orders

## 2025-02-07 ENCOUNTER — Inpatient Hospital Stay

## 2025-02-08 ENCOUNTER — Inpatient Hospital Stay: Admitting: Oncology

## 2025-08-15 ENCOUNTER — Inpatient Hospital Stay

## 2025-08-16 ENCOUNTER — Inpatient Hospital Stay: Admitting: Oncology
